# Patient Record
Sex: Female | Born: 1945 | Race: Black or African American | Hispanic: No | Marital: Married | Smoking: Never smoker
Health system: Southern US, Community
[De-identification: ages and names within clinical notes are randomized; demographics above are authoritative.]

---

## 1998-04-03 HISTORY — PX: BREAST CYST ASPIRATION: SHX578

## 1999-12-26 ENCOUNTER — Ambulatory Visit (HOSPITAL_COMMUNITY): Admission: RE | Admit: 1999-12-26 | Discharge: 1999-12-26 | Payer: Self-pay | Admitting: Neurosurgery

## 1999-12-26 ENCOUNTER — Encounter: Payer: Self-pay | Admitting: Neurosurgery

## 2004-01-22 ENCOUNTER — Ambulatory Visit: Payer: Self-pay | Admitting: Unknown Physician Specialty

## 2004-05-18 ENCOUNTER — Ambulatory Visit: Payer: Self-pay | Admitting: Internal Medicine

## 2004-06-01 ENCOUNTER — Ambulatory Visit: Payer: Self-pay | Admitting: Internal Medicine

## 2004-09-16 ENCOUNTER — Ambulatory Visit: Payer: Self-pay | Admitting: Internal Medicine

## 2004-10-01 ENCOUNTER — Ambulatory Visit: Payer: Self-pay | Admitting: Internal Medicine

## 2005-03-14 ENCOUNTER — Ambulatory Visit: Payer: Self-pay | Admitting: Internal Medicine

## 2005-04-03 ENCOUNTER — Ambulatory Visit: Payer: Self-pay | Admitting: Internal Medicine

## 2005-05-16 ENCOUNTER — Ambulatory Visit: Payer: Self-pay | Admitting: Internal Medicine

## 2005-06-01 ENCOUNTER — Ambulatory Visit: Payer: Self-pay | Admitting: Internal Medicine

## 2005-09-11 ENCOUNTER — Ambulatory Visit: Payer: Self-pay | Admitting: Internal Medicine

## 2005-10-05 ENCOUNTER — Ambulatory Visit: Payer: Self-pay | Admitting: Internal Medicine

## 2006-09-11 ENCOUNTER — Encounter: Payer: Self-pay | Admitting: General Practice

## 2006-10-02 ENCOUNTER — Encounter: Payer: Self-pay | Admitting: General Practice

## 2006-10-10 ENCOUNTER — Ambulatory Visit: Payer: Self-pay | Admitting: Family Medicine

## 2007-01-25 ENCOUNTER — Ambulatory Visit: Payer: Self-pay | Admitting: General Practice

## 2007-05-28 ENCOUNTER — Ambulatory Visit: Payer: Self-pay | Admitting: Family Medicine

## 2007-09-16 ENCOUNTER — Ambulatory Visit: Payer: Self-pay | Admitting: Internal Medicine

## 2007-10-15 ENCOUNTER — Ambulatory Visit: Payer: Self-pay | Admitting: Internal Medicine

## 2008-04-04 ENCOUNTER — Ambulatory Visit: Payer: Self-pay

## 2009-02-11 ENCOUNTER — Ambulatory Visit: Payer: Self-pay | Admitting: Internal Medicine

## 2010-03-10 ENCOUNTER — Ambulatory Visit: Payer: Self-pay | Admitting: Internal Medicine

## 2011-05-04 ENCOUNTER — Ambulatory Visit: Payer: Self-pay | Admitting: Internal Medicine

## 2012-05-10 ENCOUNTER — Ambulatory Visit: Payer: Self-pay | Admitting: Internal Medicine

## 2013-07-04 ENCOUNTER — Ambulatory Visit: Payer: Self-pay | Admitting: Internal Medicine

## 2014-07-07 ENCOUNTER — Ambulatory Visit
Admit: 2014-07-07 | Disposition: A | Discharge status: Home / Self Care | Payer: Self-pay | Attending: Internal Medicine | Admitting: Internal Medicine

## 2014-07-30 ENCOUNTER — Ambulatory Visit
Admit: 2014-07-30 | Disposition: A | Discharge status: Home / Self Care | Payer: Self-pay | Attending: Gastroenterology | Admitting: Gastroenterology

## 2015-07-02 ENCOUNTER — Other Ambulatory Visit: Payer: Self-pay | Admitting: Internal Medicine

## 2015-07-02 DIAGNOSIS — Z1231 Encounter for screening mammogram for malignant neoplasm of breast: Secondary | ICD-10-CM

## 2015-07-12 ENCOUNTER — Ambulatory Visit: Payer: Self-pay | Attending: Internal Medicine

## 2015-07-19 ENCOUNTER — Other Ambulatory Visit: Payer: Self-pay | Admitting: Internal Medicine

## 2015-07-19 ENCOUNTER — Ambulatory Visit
Admission: RE | Admit: 2015-07-19 | Discharge: 2015-07-19 | Disposition: A | Discharge status: Home / Self Care | Payer: Medicare Other | Source: Ambulatory Visit | Attending: Internal Medicine | Admitting: Internal Medicine

## 2015-07-19 DIAGNOSIS — Z1231 Encounter for screening mammogram for malignant neoplasm of breast: Secondary | ICD-10-CM | POA: Insufficient documentation

## 2016-02-15 ENCOUNTER — Ambulatory Visit: Payer: Medicare Other | Attending: Otolaryngology

## 2016-02-15 DIAGNOSIS — G4733 Obstructive sleep apnea (adult) (pediatric): Secondary | ICD-10-CM | POA: Diagnosis not present

## 2016-02-15 DIAGNOSIS — R0683 Snoring: Secondary | ICD-10-CM | POA: Diagnosis present

## 2016-07-11 ENCOUNTER — Other Ambulatory Visit: Payer: Self-pay | Admitting: Internal Medicine

## 2016-07-11 DIAGNOSIS — Z1231 Encounter for screening mammogram for malignant neoplasm of breast: Secondary | ICD-10-CM

## 2016-07-27 ENCOUNTER — Other Ambulatory Visit: Payer: Self-pay

## 2016-07-27 ENCOUNTER — Telehealth: Payer: Self-pay

## 2016-07-27 ENCOUNTER — Telehealth: Payer: Self-pay | Admitting: Gastroenterology

## 2016-07-27 DIAGNOSIS — Z1211 Encounter for screening for malignant neoplasm of colon: Secondary | ICD-10-CM

## 2016-07-27 NOTE — Telephone Encounter (Signed)
Gastroenterology Pre-Procedure Review  Request Date: Aug 22, 2016 Requesting Physician: Dr. Allen Norris  PATIENT REVIEW QUESTIONS: The patient responded to the following health history questions as indicated:    1. Are you having any GI issues? no 2. Do you have a personal history of Polyps? no 3. Do you have a family history of Colon Cancer or Polyps? no 4. Diabetes Mellitus? no 5. Joint replacements in the past 12 months?no 6. Major health problems in the past 3 months?no 7. Any artificial heart valves, MVP, or defibrillator?no    MEDICATIONS & ALLERGIES:    Patient reports the following regarding taking any anticoagulation/antiplatelet therapy:   Plavix, Coumadin, Eliquis, Xarelto, Lovenox, Pradaxa, Brilinta, or Effient? no Aspirin? yes (Baby Aspirin 81mg  )  Patient confirms/reports the following medications:  No current outpatient prescriptions on file.   No current facility-administered medications for this visit.     Patient confirms/reports the following allergies:  Allergies not on file  No orders of the defined types were placed in this encounter.   AUTHORIZATION INFORMATION Primary Insurance: 1D#: Group #:  Secondary Insurance: 1D#: Group #:  SCHEDULE INFORMATION: Date: Aug 22, 2016 Tues Time: Location:Dr. Allen Norris [facility]

## 2016-07-27 NOTE — Telephone Encounter (Signed)
07/27/16 [employer] website Does NOT require prior auth for Screening Colonoscopy [id number] / Z12.11 Decision ID#: [id number].

## 2016-08-11 ENCOUNTER — Ambulatory Visit
Admission: RE | Admit: 2016-08-11 | Discharge: 2016-08-11 | Disposition: A | Discharge status: Home / Self Care | Payer: Medicare Other | Source: Ambulatory Visit | Attending: Internal Medicine | Admitting: Internal Medicine

## 2016-08-11 DIAGNOSIS — Z1231 Encounter for screening mammogram for malignant neoplasm of breast: Secondary | ICD-10-CM | POA: Insufficient documentation

## 2016-08-21 ENCOUNTER — Telehealth: Payer: Self-pay

## 2016-08-21 NOTE — Telephone Encounter (Signed)
Pt called to reschedule colonoscopy from 08/22/16 to 09/19/16. She went out of town this past weeekend.

## 2016-08-31 ENCOUNTER — Telehealth: Payer: Self-pay

## 2016-08-31 NOTE — Telephone Encounter (Signed)
error 

## 2016-09-18 ENCOUNTER — Encounter: Payer: Self-pay | Admitting: *Deleted

## 2016-09-19 ENCOUNTER — Ambulatory Visit
Admission: RE | Admit: 2016-09-19 | Discharge: 2016-09-19 | Disposition: A | Discharge status: Home / Self Care | Payer: Medicare Other | Source: Ambulatory Visit | Attending: Gastroenterology | Admitting: Gastroenterology

## 2016-09-19 ENCOUNTER — Encounter
Admission: RE | Disposition: A | Discharge status: Home / Self Care | Payer: Self-pay | Source: Ambulatory Visit | Attending: Gastroenterology

## 2016-09-19 ENCOUNTER — Ambulatory Visit: Payer: Medicare Other | Admitting: Anesthesiology

## 2016-09-19 ENCOUNTER — Encounter: Payer: Self-pay | Admitting: *Deleted

## 2016-09-19 DIAGNOSIS — K6389 Other specified diseases of intestine: Secondary | ICD-10-CM | POA: Diagnosis not present

## 2016-09-19 DIAGNOSIS — E119 Type 2 diabetes mellitus without complications: Secondary | ICD-10-CM | POA: Insufficient documentation

## 2016-09-19 DIAGNOSIS — K5730 Diverticulosis of large intestine without perforation or abscess without bleeding: Secondary | ICD-10-CM | POA: Diagnosis not present

## 2016-09-19 DIAGNOSIS — Z1211 Encounter for screening for malignant neoplasm of colon: Secondary | ICD-10-CM | POA: Diagnosis present

## 2016-09-19 DIAGNOSIS — D120 Benign neoplasm of cecum: Secondary | ICD-10-CM | POA: Diagnosis not present

## 2016-09-19 DIAGNOSIS — Z79899 Other long term (current) drug therapy: Secondary | ICD-10-CM | POA: Diagnosis not present

## 2016-09-19 DIAGNOSIS — Z7982 Long term (current) use of aspirin: Secondary | ICD-10-CM | POA: Diagnosis not present

## 2016-09-19 HISTORY — PX: COLONOSCOPY WITH PROPOFOL: SHX5780

## 2016-09-19 SURGERY — COLONOSCOPY WITH PROPOFOL
Anesthesia: General

## 2016-09-19 MED ORDER — SODIUM CHLORIDE 0.9 % IV SOLN
INTRAVENOUS | Status: DC
  Administered 2016-09-19: 1000 mL via INTRAVENOUS

## 2016-09-19 MED ORDER — PROPOFOL 500 MG/50ML IV EMUL
INTRAVENOUS | Status: AC
  Filled 2016-09-19: qty 50

## 2016-09-19 MED ORDER — PROPOFOL 10 MG/ML IV BOLUS
INTRAVENOUS | Status: DC | PRN
  Administered 2016-09-19: 70 mg via INTRAVENOUS
  Administered 2016-09-19: 30 mg via INTRAVENOUS
  Administered 2016-09-19: 20 mg via INTRAVENOUS

## 2016-09-19 MED ORDER — LIDOCAINE HCL (PF) 2 % IJ SOLN
INTRAMUSCULAR | Status: AC
  Filled 2016-09-19: qty 2

## 2016-09-19 MED ORDER — PROPOFOL 500 MG/50ML IV EMUL
INTRAVENOUS | Status: DC | PRN
  Administered 2016-09-19: 160 ug/kg/min via INTRAVENOUS

## 2016-09-19 NOTE — Anesthesia Postprocedure Evaluation (Signed)
Anesthesia Post Note  Patient: Glenda Harris  Procedure(s) Performed: Procedure(s) (LRB): COLONOSCOPY WITH PROPOFOL (N/A)  Patient location during evaluation: Endoscopy Anesthesia Type: General Level of consciousness: awake and alert Pain management: pain level controlled Vital Signs Assessment: post-procedure vital signs reviewed and stable Respiratory status: spontaneous breathing, nonlabored ventilation, respiratory function stable and patient connected to nasal cannula oxygen Cardiovascular status: blood pressure returned to baseline and stable Postop Assessment: no signs of nausea or vomiting Anesthetic complications: no     Last Vitals:  Vitals:   09/19/16 0936 09/19/16 0946  BP: (!) 87/53 97/67  Pulse: 78 73  Resp: 14 14  Temp: 36.2 C     Last Pain:  Vitals:   09/19/16 0936  TempSrc: Tympanic                 Martha Clan

## 2016-09-19 NOTE — Anesthesia Preprocedure Evaluation (Signed)
Anesthesia Evaluation  Patient identified by MRN, date of birth, ID band Patient awake    Reviewed: Allergy & Precautions, H&P , NPO status , Patient's Chart, lab work & pertinent test results, reviewed documented beta blocker date and time   History of Anesthesia Complications Negative for: history of anesthetic complications  Airway Mallampati: I  TM Distance: >3 FB Neck ROM: full    Dental  (+) Missing, Teeth Intact, Dental Advidsory Given   Pulmonary neg pulmonary ROS,           Cardiovascular Exercise Tolerance: Good negative cardio ROS       Neuro/Psych negative neurological ROS  negative psych ROS   GI/Hepatic negative GI ROS, Neg liver ROS,   Endo/Other  diabetes (borderline)  Renal/GU negative Renal ROS  negative genitourinary   Musculoskeletal   Abdominal   Peds  Hematology negative hematology ROS (+)   Anesthesia Other Findings History reviewed. No pertinent past medical history.   Reproductive/Obstetrics negative OB ROS                             Anesthesia Physical Anesthesia Plan  ASA: II  Anesthesia Plan: General   Post-op Pain Management:    Induction: Intravenous  PONV Risk Score and Plan: 3 and Propofol  Airway Management Planned: Natural Airway  Additional Equipment:   Intra-op Plan:   Post-operative Plan:   Informed Consent: I have reviewed the patients History and Physical, chart, labs and discussed the procedure including the risks, benefits and alternatives for the proposed anesthesia with the patient or authorized representative who has indicated his/her understanding and acceptance.   Dental Advisory Given  Plan Discussed with: Anesthesiologist, CRNA and Surgeon  Anesthesia Plan Comments:         Anesthesia Quick Evaluation

## 2016-09-19 NOTE — Op Note (Signed)
[facility] Gastroenterology Patient Name: Glenda Harris Procedure Date: 09/19/2016 9:11 AM MRN: [id number] Account #: [id number] Date of Birth: 04/11/1945 Admit Type: Outpatient Age: 71 Room: [facility] ENDO ROOM 4 Gender: Female Note Status: Finalized Procedure:            Colonoscopy Indications:          Screening for colorectal malignant neoplasm Providers:            Lucilla Lame MD, MD Referring MD:         Casilda Carls, MD (Referring MD) Medicines:            Propofol per Anesthesia Complications:        No immediate complications. Procedure:            Pre-Anesthesia Assessment:                       - Prior to the procedure, a History and Physical was                        performed, and patient medications and allergies were                        reviewed. The patient's tolerance of previous                        anesthesia was also reviewed. The risks and benefits of                        the procedure and the sedation options and risks were                        discussed with the patient. All questions were                        answered, and informed consent was obtained. Prior                        Anticoagulants: The patient has taken no previous                        anticoagulant or antiplatelet agents. ASA Grade                        Assessment: II - A patient with mild systemic disease.                        After reviewing the risks and benefits, the patient was                        deemed in satisfactory condition to undergo the                        procedure.                       After obtaining informed consent, the colonoscope was                        passed under direct vision. Throughout the procedure,  the patient's blood pressure, pulse, and oxygen                        saturations were monitored continuously. The                        Colonoscope was introduced through the anus and                         advanced to the the cecum, identified by appendiceal                        orifice and ileocecal valve. The colonoscopy was                        performed without difficulty. The patient tolerated the                        procedure well. The quality of the bowel preparation                        was excellent. Findings:      The perianal and digital rectal examinations were normal.      A 2 mm polyp was found in the cecum. The polyp was sessile. The polyp       was removed with a cold biopsy forceps. Resection and retrieval were       complete.      A few small-mouthed diverticula were found in the entire colon. Impression:           - One 2 mm polyp in the cecum, removed with a cold                        biopsy forceps. Resected and retrieved.                       - Diverticulosis in the entire examined colon. Recommendation:       - Discharge patient to home.                       - Resume previous diet.                       - Continue present medications.                       - Await pathology results.                       - Repeat colonoscopy in 5 years if polyp adenoma and 10                        years if hyperplastic Procedure Code(s):    --- Professional ---                       [phone], Colonoscopy, flexible; with biopsy, single or                        multiple Diagnosis Code(s):    --- Professional ---  Z12.11, Encounter for screening for malignant neoplasm                        of colon                       D12.0, Benign neoplasm of cecum CPT copyright 2016 [employer]. All rights reserved. The codes documented in this report are preliminary and upon coder review may  be revised to meet current compliance requirements. Lucilla Lame MD, MD 09/19/2016 9:35:07 AM This report has been signed electronically. Number of Addenda: 0 Note Initiated On: 09/19/2016 9:11 AM Scope Withdrawal Time: 0 hours 6 minutes 58 seconds   Total Procedure Duration: 0 hours 11 minutes 39 seconds       [facility]

## 2016-09-19 NOTE — H&P (Signed)
   Lucilla Lame, MD [address] [place], [place] [address] Phone: [phone] Fax : [phone]  Primary Care Physician:  Casilda Carls, MD Primary Gastroenterologist:  Dr. Allen Norris  Pre-Procedure History & Physical: HPI:  Glenda Harris is a 71 y.o. female is here for a screening colonoscopy.   History reviewed. No pertinent past medical history.  Past Surgical History:  Procedure Laterality Date  . BREAST CYST ASPIRATION Left 2000   neg    Prior to Admission medications   Medication Sig Start Date End Date Taking? Authorizing Provider  aspirin EC 81 MG tablet Take 81 mg by mouth daily.   Yes [provider]  lisinopril (PRINIVIL,ZESTRIL) 10 MG tablet Take 10 mg by mouth daily.   Yes [provider]  rosuvastatin (CRESTOR) 20 MG tablet Take 20 mg by mouth daily.   Yes [provider]    Allergies as of 07/27/2016  . (Not on File)    History reviewed. No pertinent family history.  Social History   Social History  . Marital status: Married    Spouse name: N/A  . Number of children: N/A  . Years of education: N/A   Occupational History  . Not on file.   Social History Main Topics  . Smoking status: Not on file  . Smokeless tobacco: Not on file  . Alcohol use Not on file  . Drug use: Unknown  . Sexual activity: Not on file   Other Topics Concern  . Not on file   Social History Narrative  . No narrative on file    Review of Systems: See HPI, otherwise negative ROS  Physical Exam: BP 96/73   Pulse 71   Temp (!) 96.7 F (35.9 C) (Tympanic)   Resp 16   Ht 5\' 8"  (1.727 m)   Wt 167 lb (75.8 kg)   SpO2 99%   BMI 25.39 kg/m  General:   Alert,  pleasant and cooperative in NAD Head:  Normocephalic and atraumatic. Neck:  Supple; no masses or thyromegaly. Lungs:  Clear throughout to auscultation.    Heart:  Regular rate and rhythm. Abdomen:  Soft, nontender and nondistended. Normal bowel sounds, without guarding,  and without rebound.   Neurologic:  Alert and  oriented x4;  grossly normal neurologically.  Impression/Plan: Glenda Harris is now here to undergo a screening colonoscopy.  Risks, benefits, and alternatives regarding colonoscopy have been reviewed with the patient.  Questions have been answered.  All parties agreeable.

## 2016-09-19 NOTE — Anesthesia Post-op Follow-up Note (Cosign Needed)
Anesthesia QCDR form completed.        

## 2016-09-19 NOTE — Transfer of Care (Signed)
Immediate Anesthesia Transfer of Care Note  Patient: Glenda Harris  Procedure(s) Performed: Procedure(s): COLONOSCOPY WITH PROPOFOL (N/A)  Patient Location: PACU  Anesthesia Type:General  Level of Consciousness: awake, alert  and oriented  Airway & Oxygen Therapy: Patient Spontanous Breathing and Patient connected to nasal cannula oxygen  Post-op Assessment: Report given to RN and Post -op Vital signs reviewed and stable  Post vital signs: Reviewed and stable  Last Vitals:  Vitals:   09/19/16 0852 09/19/16 0936  BP: 96/73 (!) 87/53  Pulse: 71 78  Resp: 16 14  Temp: (!) 35.9 C 36.2 C    Last Pain:  Vitals:   09/19/16 0936  TempSrc: Tympanic         Complications: No apparent anesthesia complications

## 2016-09-20 ENCOUNTER — Encounter: Payer: Self-pay | Admitting: Gastroenterology

## 2016-09-20 LAB — SURGICAL PATHOLOGY

## 2017-02-01 ENCOUNTER — Encounter: Payer: Self-pay | Admitting: Obstetrics and Gynecology

## 2017-02-01 ENCOUNTER — Ambulatory Visit (INDEPENDENT_AMBULATORY_CARE_PROVIDER_SITE_OTHER): Payer: Medicare Other | Admitting: Obstetrics and Gynecology

## 2017-02-01 VITALS — BP 108/62 | HR 71 | Ht 68.0 in | Wt 169.2 lb

## 2017-02-01 DIAGNOSIS — N952 Postmenopausal atrophic vaginitis: Secondary | ICD-10-CM

## 2017-02-01 DIAGNOSIS — N362 Urethral caruncle: Secondary | ICD-10-CM | POA: Diagnosis not present

## 2017-02-01 NOTE — Progress Notes (Signed)
HPI:      [name] is a 71 y.o. [phone] who LMP was No LMP recorded. Patient has had a hysterectomy.  Subjective:   She presents today sent by her edition because some irregularity (possibly polyps) were noted inside the vagina.  The patient states that she also sees a red spot when she uses a mirror to look at her vagina. She reports no problems with vaginal bleeding or difficulty with intercourse. She describes getting up to use the bathroom at least twice per night but otherwise has no urinary complaints. She previously had a hysterectomy.    Hx: The following portions of the patient's history were reviewed and updated as appropriate:             She  has no past medical history on file. She  does not have any pertinent problems on file. She  has a past surgical history that includes Breast cyst aspiration (Left, 2000) and Colonoscopy with propofol (N/A, 09/19/2016). Her family history is not on file. She  reports that she has never smoked. She has never used smokeless tobacco. She reports that she does not drink alcohol or use drugs. She is allergic to flagyl [metronidazole].       Review of Systems:  Review of Systems  Constitutional: Denied constitutional symptoms, night sweats, recent illness, fatigue, fever, insomnia and weight loss.  Eyes: Denied eye symptoms, eye pain, photophobia, vision change and visual disturbance.  Ears/Nose/Throat/Neck: Denied ear, nose, throat or neck symptoms, hearing loss, nasal discharge, sinus congestion and sore throat.  Cardiovascular: Denied cardiovascular symptoms, arrhythmia, chest pain/pressure, edema, exercise intolerance, orthopnea and palpitations.  Respiratory: Denied pulmonary symptoms, asthma, pleuritic pain, productive sputum, cough, dyspnea and wheezing.  Gastrointestinal: Denied, gastro-esophageal reflux, melena, nausea and vomiting.  Genitourinary: See HPI for additional information.  Musculoskeletal: Denied musculoskeletal  symptoms, stiffness, swelling, muscle weakness and myalgia.  Dermatologic: Denied dermatology symptoms, rash and scar.  Neurologic: Denied neurology symptoms, dizziness, headache, neck pain and syncope.  Psychiatric: Denied psychiatric symptoms, anxiety and depression.  Endocrine: Denied endocrine symptoms including hot flashes and night sweats.   Meds:   Current Outpatient Prescriptions on File Prior to Visit  Medication Sig Dispense Refill  . aspirin EC 81 MG tablet Take 81 mg by mouth daily.    Marland Kitchen lisinopril (PRINIVIL,ZESTRIL) 10 MG tablet Take 10 mg by mouth daily.    . rosuvastatin (CRESTOR) 20 MG tablet Take 20 mg by mouth daily.     No current facility-administered medications on file prior to visit.     Objective:     Vitals:   02/01/17 1024  BP: 108/62  Pulse: 71              Physical examination   Pelvic:   Vulva: Normal appearance.  No lesions.  Vagina:  Atrophic vagina with some punctations representing blood vessels.  Skin tags at vaginal cuff noted -left over tags from previous hysterectomy.  Support:  Adequate pelvic support  Urethra No masses tenderness or scarring.  Very small urethral caruncle noted  Meatus Normal size without lesions or prolapse.  Cervix:  Surgically absent  Anus: Normal exam.  No lesions.  Perineum: Normal exam.  No lesions.        Bimanual   Uterus:  Surgically absent  Adnexae: No masses.  Non-tender to palpation.  Cul-de-sac: Negative for abnormality.     Assessment:    [id number]    1. Atrophic vaginitis   2. Urethral caruncle  No evidence of polyp or other vaginal lesion.  Small skin tags at vaginal apex noted status post previous hysterectomy.   Patient reports no problems from atrophic vaginitis or from her urethral caruncle.  We have discussed the use of estrogen cream for both of these issues but she has declined this at this time.   Plan:            1.  Have discussed physical findings in detail with the patient.   Elective treatment options discussed.  Patient has elected not to do anything at this time since her exam is normal for a 71 year old status post hysterectomy. Orders No orders of the defined types were placed in this encounter.   No orders of the defined types were placed in this encounter.     F/U  Return for Pt to contact us if symptoms worsen. I spent 31 minutes with this patient of which greater than 50% was spent discussing vaginal physical findings as described above, possible treatment options, meaning of vaginal cuff skin tags and urethral caruncle.  All of her questions were answered.  Finis Bud, M.D. 02/01/2017 11:23 AM

## 2017-02-28 ENCOUNTER — Encounter: Payer: Self-pay | Admitting: Obstetrics and Gynecology

## 2017-07-24 ENCOUNTER — Other Ambulatory Visit: Payer: Self-pay | Admitting: Internal Medicine

## 2017-07-24 DIAGNOSIS — Z1231 Encounter for screening mammogram for malignant neoplasm of breast: Secondary | ICD-10-CM

## 2017-08-13 ENCOUNTER — Ambulatory Visit
Admission: RE | Admit: 2017-08-13 | Discharge: 2017-08-13 | Disposition: A | Discharge status: Home / Self Care | Payer: Medicare Other | Source: Ambulatory Visit | Attending: Internal Medicine | Admitting: Internal Medicine

## 2017-08-13 DIAGNOSIS — Z1231 Encounter for screening mammogram for malignant neoplasm of breast: Secondary | ICD-10-CM | POA: Diagnosis not present

## 2018-09-09 ENCOUNTER — Other Ambulatory Visit: Payer: Self-pay | Admitting: Internal Medicine

## 2018-09-09 DIAGNOSIS — Z1231 Encounter for screening mammogram for malignant neoplasm of breast: Secondary | ICD-10-CM

## 2018-10-15 ENCOUNTER — Ambulatory Visit (INDEPENDENT_AMBULATORY_CARE_PROVIDER_SITE_OTHER): Payer: Medicare Other | Admitting: Obstetrics and Gynecology

## 2018-10-15 ENCOUNTER — Other Ambulatory Visit: Payer: Self-pay

## 2018-10-15 ENCOUNTER — Encounter: Payer: Self-pay | Admitting: Obstetrics and Gynecology

## 2018-10-15 VITALS — BP 138/76 | HR 71 | Ht 68.0 in | Wt 187.9 lb

## 2018-10-15 DIAGNOSIS — N362 Urethral caruncle: Secondary | ICD-10-CM

## 2018-10-15 DIAGNOSIS — N952 Postmenopausal atrophic vaginitis: Secondary | ICD-10-CM | POA: Diagnosis not present

## 2018-10-15 MED ORDER — ESTROGENS, CONJUGATED 0.625 MG/GM VA CREA: TOPICAL_CREAM | VAGINAL | 1 refills | Status: DC

## 2018-10-15 NOTE — Progress Notes (Signed)
HPI:      [name] is a 73 y.o. [phone] who LMP was No LMP recorded. Patient has had a hysterectomy.  Subjective:   She presents today for her annual examination.  With 2 complaints.  Her first complaint is of vaginal dryness.  She uses a small amount of lubricant every day but this does not seem to be helping as well as it used to. Her second issue is that her urine stream has changed direction and is now spattering and she has noticed a pink lump near her urethra.    Hx: The following portions of the patient's history were reviewed and updated as appropriate:             She  has no past medical history on file. She does not have any pertinent problems on file. She  has a past surgical history that includes Colonoscopy with propofol (N/A, 09/19/2016) and Breast cyst aspiration (Left, 2000). Her family history is not on file. She  reports that she has never smoked. She has never used smokeless tobacco. She reports that she does not drink alcohol or use drugs. She has a current medication list which includes the following prescription(s): aspirin ec, calcium carb-cholecalciferol, cyclobenzaprine, lisinopril, propranolol, and rosuvastatin. She is allergic to flagyl [metronidazole].       Review of Systems:  Review of Systems  Constitutional: Denied constitutional symptoms, night sweats, recent illness, fatigue, fever, insomnia and weight loss.  Eyes: Denied eye symptoms, eye pain, photophobia, vision change and visual disturbance.  Ears/Nose/Throat/Neck: Denied ear, nose, throat or neck symptoms, hearing loss, nasal discharge, sinus congestion and sore throat.  Cardiovascular: Denied cardiovascular symptoms, arrhythmia, chest pain/pressure, edema, exercise intolerance, orthopnea and palpitations.  Respiratory: Denied pulmonary symptoms, asthma, pleuritic pain, productive sputum, cough, dyspnea and wheezing.  Gastrointestinal: Denied, gastro-esophageal reflux, melena, nausea and  vomiting.  Genitourinary: See HPI for additional information.  Musculoskeletal: Denied musculoskeletal symptoms, stiffness, swelling, muscle weakness and myalgia.  Dermatologic: Denied dermatology symptoms, rash and scar.  Neurologic: Denied neurology symptoms, dizziness, headache, neck pain and syncope.  Psychiatric: Denied psychiatric symptoms, anxiety and depression.  Endocrine: Denied endocrine symptoms including hot flashes and night sweats.   Meds:   Current Outpatient Medications on File Prior to Visit  Medication Sig Dispense Refill  . aspirin EC 81 MG tablet Take 81 mg by mouth daily.    . Calcium Carb-Cholecalciferol (CALCIUM 600+D3) 600-200 MG-UNIT TABS Calcium 600 + D(3)    . cyclobenzaprine (FLEXERIL) 10 MG tablet cyclobenzaprine 10 mg tablet    . lisinopril (PRINIVIL,ZESTRIL) 10 MG tablet Take 10 mg by mouth daily.    . propranolol (INDERAL) 10 MG tablet   0  . rosuvastatin (CRESTOR) 20 MG tablet Take 20 mg by mouth daily.     No current facility-administered medications on file prior to visit.     Objective:     Vitals:   10/15/18 0912  BP: 138/76  Pulse: 71       Pelvic:   Vulva: Normal appearance.  No lesions.   Vagina: No lesions or abnormalities noted.  Significant vaginal atrophy noted  Support: Normal pelvic support.  Urethra  urethral caruncle noted.  Slightly larger than noted at last visit.  Meatus Normal size without lesions or prolapse.  Cervix: Surgically absent   Anus: Normal exam.  No lesions.  Perineum: Normal exam.  No lesions.        Bimanual   Uterus: Surgically absent   Adnexae: No masses.  Non-tender to palpation.  Cul-de-sac: Negative for abnormality.     Assessment:    [id number] Patient Active Problem List   Diagnosis Date Noted  . Colon cancer screening   . Benign neoplasm of cecum      1. Atrophic vaginitis   2. Urethral caruncle     Patient now having issues with vaginal atrophy and urethral caruncle. She is now willing  to use medication to try to alleviate these issues.   Plan:            1.  Premarin cream discussed in detail.  Use of Premarin on urethral caruncle discussed.  Use of Premarin intravaginally for vaginal dryness and atrophy discussed. Orders No orders of the defined types were placed in this encounter.   No orders of the defined types were placed in this encounter.       F/U  Return for Pt to contact us if symptoms worsen. I spent 16 minutes involved in the care of this patient of which greater than 50% was spent discussing urethral caruncle, vaginal atrophy, use of estrogen cream.  All questions answered  Finis Bud, M.D. 10/15/2018 9:42 AM

## 2018-10-15 NOTE — Progress Notes (Signed)
Patient comes in today for vaginal dryness and a lump in the vaginal area.

## 2018-10-22 ENCOUNTER — Ambulatory Visit
Admission: RE | Admit: 2018-10-22 | Discharge: 2018-10-22 | Disposition: A | Discharge status: Home / Self Care | Payer: Medicare Other | Source: Ambulatory Visit | Attending: Internal Medicine | Admitting: Internal Medicine

## 2018-10-22 DIAGNOSIS — Z1231 Encounter for screening mammogram for malignant neoplasm of breast: Secondary | ICD-10-CM | POA: Diagnosis not present

## 2018-10-28 DIAGNOSIS — M47816 Spondylosis without myelopathy or radiculopathy, lumbar region: Secondary | ICD-10-CM | POA: Insufficient documentation

## 2018-10-28 DIAGNOSIS — M1611 Unilateral primary osteoarthritis, right hip: Secondary | ICD-10-CM | POA: Insufficient documentation

## 2019-02-10 ENCOUNTER — Other Ambulatory Visit: Payer: Self-pay | Admitting: Surgery

## 2019-02-10 ENCOUNTER — Telehealth: Payer: Self-pay | Admitting: *Deleted

## 2019-02-10 DIAGNOSIS — M4807 Spinal stenosis, lumbosacral region: Secondary | ICD-10-CM

## 2019-02-10 DIAGNOSIS — M47816 Spondylosis without myelopathy or radiculopathy, lumbar region: Secondary | ICD-10-CM

## 2019-03-06 ENCOUNTER — Ambulatory Visit
Admission: RE | Admit: 2019-03-06 | Discharge: 2019-03-06 | Disposition: A | Discharge status: Home / Self Care | Payer: Medicare Other | Source: Ambulatory Visit | Attending: Surgery | Admitting: Surgery

## 2019-03-06 ENCOUNTER — Other Ambulatory Visit: Payer: Self-pay

## 2019-03-06 DIAGNOSIS — M47816 Spondylosis without myelopathy or radiculopathy, lumbar region: Secondary | ICD-10-CM | POA: Diagnosis present

## 2019-03-06 DIAGNOSIS — M4807 Spinal stenosis, lumbosacral region: Secondary | ICD-10-CM | POA: Insufficient documentation

## 2019-03-07 DIAGNOSIS — M5416 Radiculopathy, lumbar region: Secondary | ICD-10-CM | POA: Insufficient documentation

## 2019-04-28 ENCOUNTER — Other Ambulatory Visit: Payer: Self-pay | Admitting: Obstetrics and Gynecology

## 2019-04-28 DIAGNOSIS — N952 Postmenopausal atrophic vaginitis: Secondary | ICD-10-CM

## 2019-04-28 DIAGNOSIS — N362 Urethral caruncle: Secondary | ICD-10-CM

## 2019-09-16 ENCOUNTER — Other Ambulatory Visit: Payer: Self-pay | Admitting: Internal Medicine

## 2019-09-16 DIAGNOSIS — Z1231 Encounter for screening mammogram for malignant neoplasm of breast: Secondary | ICD-10-CM

## 2019-10-01 ENCOUNTER — Other Ambulatory Visit: Payer: Self-pay

## 2019-10-01 ENCOUNTER — Encounter: Payer: Self-pay | Admitting: Obstetrics and Gynecology

## 2019-10-01 ENCOUNTER — Ambulatory Visit (INDEPENDENT_AMBULATORY_CARE_PROVIDER_SITE_OTHER): Payer: Medicare PPO | Admitting: Obstetrics and Gynecology

## 2019-10-01 VITALS — BP 117/78 | HR 81 | Ht 68.0 in | Wt 177.4 lb

## 2019-10-01 DIAGNOSIS — N362 Urethral caruncle: Secondary | ICD-10-CM | POA: Diagnosis not present

## 2019-10-01 DIAGNOSIS — N952 Postmenopausal atrophic vaginitis: Secondary | ICD-10-CM

## 2019-10-01 NOTE — Progress Notes (Signed)
HPI:      [name] is a 74 y.o. G3P3003 who LMP was No LMP recorded. Patient has had a hysterectomy.  Subjective:   She presents today with complaint of vaginal dryness and feels some "internal bumps".  She states that her urethral carbuncle is "about the same".  She uses daily over-the-counter vaginal moisturizers.  She used vaginal estrogen for short time but has discontinued its use. Patient is not currently sexually active.    Hx: The following portions of the patient's history were reviewed and updated as appropriate:             She  has no past medical history on file. She does not have any pertinent problems on file. She  has a past surgical history that includes Colonoscopy with propofol (N/A, 09/19/2016) and Breast cyst aspiration (Left, 2000). Her family history is not on file. She  reports that she has never smoked. She has never used smokeless tobacco. She reports that she does not drink alcohol and does not use drugs. She has a current medication list which includes the following prescription(s): aspirin ec, calcium carb-cholecalciferol, premarin, cyclobenzaprine, propranolol, and rosuvastatin. She is allergic to flagyl [metronidazole].       Review of Systems:  Review of Systems  Constitutional: Denied constitutional symptoms, night sweats, recent illness, fatigue, fever, insomnia and weight loss.  Eyes: Denied eye symptoms, eye pain, photophobia, vision change and visual disturbance.  Ears/Nose/Throat/Neck: Denied ear, nose, throat or neck symptoms, hearing loss, nasal discharge, sinus congestion and sore throat.  Cardiovascular: Denied cardiovascular symptoms, arrhythmia, chest pain/pressure, edema, exercise intolerance, orthopnea and palpitations.  Respiratory: Denied pulmonary symptoms, asthma, pleuritic pain, productive sputum, cough, dyspnea and wheezing.  Gastrointestinal: Denied, gastro-esophageal reflux, melena, nausea and vomiting.  Genitourinary: See  HPI for additional information.  Musculoskeletal: Denied musculoskeletal symptoms, stiffness, swelling, muscle weakness and myalgia.  Dermatologic: Denied dermatology symptoms, rash and scar.  Neurologic: Denied neurology symptoms, dizziness, headache, neck pain and syncope.  Psychiatric: Denied psychiatric symptoms, anxiety and depression.  Endocrine: Denied endocrine symptoms including hot flashes and night sweats.   Meds:   Current Outpatient Medications on File Prior to Visit  Medication Sig Dispense Refill  . aspirin EC 81 MG tablet Take 81 mg by mouth daily.    . Calcium Carb-Cholecalciferol (CALCIUM 600+D3) 600-200 MG-UNIT TABS Calcium 600 + D(3)    . conjugated estrogens (PREMARIN) vaginal cream USE 1/3 APPLICATOR VAGINALLY EVERY EVENING AT BEDTIME FOR 6 WEEKS THEN EVERY OTHER DAY AS DIRECTED 30 g 1  . cyclobenzaprine (FLEXERIL) 10 MG tablet cyclobenzaprine 10 mg tablet    . propranolol (INDERAL) 10 MG tablet   0  . rosuvastatin (CRESTOR) 20 MG tablet Take 20 mg by mouth daily.     No current facility-administered medications on file prior to visit.    Objective:     Vitals:   10/01/19 0852  BP: 117/78  Pulse: 81              Physical examination   Pelvic:  Vulva: Normal appearance.  No lesions.  Vagina: No lesions or abnormalities noted.  Mild to moderate vaginal atrophy  Support: Normal pelvic support.  Urethra No masses tenderness or scarring.  Small urethral caruncle present  Meatus Normal size without lesions or prolapse.  Cervix:  Surgically absent  Anus: Normal exam.  No lesions.  Perineum: Normal exam.  No lesions.     Assessment:    G3P3003 Patient Active Problem List   Diagnosis  Date Noted  . Colon cancer screening   . Benign neoplasm of cecum      1. Atrophic vaginitis   2. Urethral caruncle     I could find no vaginal abnormality or palpate no "internal bumps".  Urethral caruncle is small, of lesser size then previously noted.   Plan:             1.  We have again discussed the use of estrogen vaginal cream for both the urethral caruncle and her vaginal dryness issues.  I believe that with continued extended use she would notice a significant difference and be happy with the results.  All of her questions were answered.  She will begin to use Premarin vaginal cream daily for 1 month, then every other day for 1 month, then twice weekly. Orders No orders of the defined types were placed in this encounter.   No orders of the defined types were placed in this encounter.     F/U  Return for Annual Physical. I spent 22 minutes involved in the care of this patient preparing to see the patient by obtaining and reviewing her medical history (including labs, imaging tests and prior procedures), documenting clinical information in the electronic health record (EHR), counseling and coordinating care plans, writing and sending prescriptions, ordering tests or procedures and directly communicating with the patient by discussing pertinent items from her history and physical exam as well as detailing my assessment and plan as noted above so that she has an informed understanding.  All of her questions were answered.  Finis Bud, M.D. 10/01/2019 9:58 AM

## 2019-10-23 ENCOUNTER — Ambulatory Visit
Admission: RE | Admit: 2019-10-23 | Discharge: 2019-10-23 | Disposition: A | Discharge status: Home / Self Care | Payer: Medicare PPO | Source: Ambulatory Visit | Attending: Internal Medicine | Admitting: Internal Medicine

## 2019-10-23 DIAGNOSIS — Z1231 Encounter for screening mammogram for malignant neoplasm of breast: Secondary | ICD-10-CM | POA: Diagnosis not present

## 2019-10-28 ENCOUNTER — Other Ambulatory Visit: Payer: Self-pay | Admitting: Obstetrics and Gynecology

## 2019-10-28 DIAGNOSIS — N362 Urethral caruncle: Secondary | ICD-10-CM

## 2019-10-28 DIAGNOSIS — N952 Postmenopausal atrophic vaginitis: Secondary | ICD-10-CM

## 2020-10-08 ENCOUNTER — Other Ambulatory Visit: Payer: Self-pay | Admitting: Internal Medicine

## 2020-10-08 DIAGNOSIS — Z1231 Encounter for screening mammogram for malignant neoplasm of breast: Secondary | ICD-10-CM

## 2020-10-13 ENCOUNTER — Other Ambulatory Visit: Payer: Self-pay

## 2020-10-13 MED ORDER — SUPREP BOWEL PREP KIT 17.5-3.13-1.6 GM/177ML PO SOLN: 1.0000 | ORAL | 0 refills | Status: DC

## 2020-10-25 ENCOUNTER — Ambulatory Visit: Payer: Medicare PPO | Admitting: Certified Registered"

## 2020-10-25 ENCOUNTER — Ambulatory Visit
Admission: RE | Admit: 2020-10-25 | Discharge: 2020-10-25 | Disposition: A | Discharge status: Home / Self Care | Payer: Medicare PPO | Source: Ambulatory Visit | Attending: Gastroenterology | Admitting: Gastroenterology

## 2020-10-25 ENCOUNTER — Encounter
Admission: RE | Disposition: A | Discharge status: Home / Self Care | Payer: Self-pay | Source: Ambulatory Visit | Attending: Gastroenterology

## 2020-10-25 DIAGNOSIS — D120 Benign neoplasm of cecum: Secondary | ICD-10-CM | POA: Insufficient documentation

## 2020-10-25 DIAGNOSIS — Z7982 Long term (current) use of aspirin: Secondary | ICD-10-CM | POA: Insufficient documentation

## 2020-10-25 DIAGNOSIS — Z1211 Encounter for screening for malignant neoplasm of colon: Secondary | ICD-10-CM | POA: Insufficient documentation

## 2020-10-25 DIAGNOSIS — K635 Polyp of colon: Secondary | ICD-10-CM | POA: Diagnosis not present

## 2020-10-25 DIAGNOSIS — Z79899 Other long term (current) drug therapy: Secondary | ICD-10-CM | POA: Diagnosis not present

## 2020-10-25 DIAGNOSIS — K5730 Diverticulosis of large intestine without perforation or abscess without bleeding: Secondary | ICD-10-CM | POA: Diagnosis not present

## 2020-10-25 DIAGNOSIS — Z881 Allergy status to other antibiotic agents status: Secondary | ICD-10-CM | POA: Insufficient documentation

## 2020-10-25 DIAGNOSIS — Z79890 Hormone replacement therapy (postmenopausal): Secondary | ICD-10-CM | POA: Diagnosis not present

## 2020-10-25 HISTORY — PX: COLONOSCOPY: SHX5424

## 2020-10-25 SURGERY — COLONOSCOPY
Anesthesia: General

## 2020-10-25 MED ORDER — PROPOFOL 500 MG/50ML IV EMUL
INTRAVENOUS | Status: DC | PRN
  Administered 2020-10-25: 145 ug/kg/min via INTRAVENOUS

## 2020-10-25 MED ORDER — PROPOFOL 10 MG/ML IV BOLUS
INTRAVENOUS | Status: DC | PRN
  Administered 2020-10-25: 30 mg via INTRAVENOUS
  Administered 2020-10-25: 50 mg via INTRAVENOUS
  Administered 2020-10-25: 20 mg via INTRAVENOUS

## 2020-10-25 MED ORDER — SODIUM CHLORIDE 0.9 % IV SOLN
INTRAVENOUS | Status: DC
  Administered 2020-10-25: 20 mL/h via INTRAVENOUS

## 2020-10-25 MED ORDER — LIDOCAINE HCL (CARDIAC) PF 100 MG/5ML IV SOSY
PREFILLED_SYRINGE | INTRAVENOUS | Status: DC | PRN
  Administered 2020-10-25: 100 mg via INTRAVENOUS

## 2020-10-25 MED ORDER — EPHEDRINE SULFATE 50 MG/ML IJ SOLN
INTRAMUSCULAR | Status: DC | PRN
  Administered 2020-10-25 (×2): 5 mg via INTRAVENOUS

## 2020-10-25 NOTE — Anesthesia Preprocedure Evaluation (Signed)
Anesthesia Evaluation  Patient identified by MRN, date of birth, ID band Patient awake    Reviewed: Allergy & Precautions, NPO status , Patient's Chart, lab work & pertinent test results  Airway Mallampati: II  TM Distance: >3 FB Neck ROM: full    Dental  (+) Chipped   Pulmonary neg pulmonary ROS,    Pulmonary exam normal        Cardiovascular negative cardio ROS Normal cardiovascular exam     Neuro/Psych negative neurological ROS  negative psych ROS   GI/Hepatic negative GI ROS, Neg liver ROS,   Endo/Other  negative endocrine ROS  Renal/GU negative Renal ROS  negative genitourinary   Musculoskeletal   Abdominal   Peds  Hematology negative hematology ROS (+)   Anesthesia Other Findings No past medical history on file.  Past Surgical History: 2000: BREAST CYST ASPIRATION; Left     Comment:  neg 09/19/2016: COLONOSCOPY WITH PROPOFOL; N/A     Comment:  Procedure: COLONOSCOPY WITH PROPOFOL;  Surgeon: Lucilla Lame, MD;  Location: [facility] ENDOSCOPY;  Service:               Endoscopy;  Laterality: N/A;  BMI    Body Mass Index: 26.76 kg/m      Reproductive/Obstetrics negative OB ROS                             Anesthesia Physical Anesthesia Plan  ASA: 2  Anesthesia Plan: General   Post-op Pain Management:    Induction: Intravenous  PONV Risk Score and Plan: Propofol infusion and TIVA  Airway Management Planned: Natural Airway and Nasal Cannula  Additional Equipment:   Intra-op Plan:   Post-operative Plan:   Informed Consent: I have reviewed the patients History and Physical, chart, labs and discussed the procedure including the risks, benefits and alternatives for the proposed anesthesia with the patient or authorized representative who has indicated his/her understanding and acceptance.     Dental Advisory Given  Plan Discussed with: Anesthesiologist, CRNA  and Surgeon  Anesthesia Plan Comments: (Patient consented for risks of anesthesia including but not limited to:  - adverse reactions to medications - risk of airway placement if required - damage to eyes, teeth, lips or other oral mucosa - nerve damage due to positioning  - sore throat or hoarseness - Damage to heart, brain, nerves, lungs, other parts of body or loss of life  Patient voiced understanding.)        Anesthesia Quick Evaluation

## 2020-10-25 NOTE — H&P (Signed)
Glenda Bellows, MD [address] [place], [place], [place], [address] [address], [place], [place], [place], [address] Phone: [phone]  Fax: [phone]  Primary Care Physician:  Casilda Carls, MD   Pre-Procedure History & Physical: HPI:  Glenda Harris is a 75 y.o. female is here for an colonoscopy.   No past medical history on file.  Past Surgical History:  Procedure Laterality Date   BREAST CYST ASPIRATION Left 2000   neg   COLONOSCOPY WITH PROPOFOL N/A 09/19/2016   Procedure: COLONOSCOPY WITH PROPOFOL;  Surgeon: Lucilla Lame, MD;  Location: [facility] ENDOSCOPY;  Service: Endoscopy;  Laterality: N/A;    Prior to Admission medications   Medication Sig Start Date End Date Taking? Authorizing Provider  aspirin EC 81 MG tablet Take 81 mg by mouth daily.   Yes [provider]  Calcium Carb-Cholecalciferol 600-200 MG-UNIT TABS Calcium 600 + D(3)   Yes [provider]  conjugated estrogens (PREMARIN) vaginal cream USE 1/3 OF THE APPLICATOR VAGINALLY EVERY EVENING AT BEDTIME FOR 6 WEEKS THEN EVERY OTHER DAY AS DIRECTED. 10/28/19  Yes Harlin Heys, MD  cyclobenzaprine (FLEXERIL) 10 MG tablet cyclobenzaprine 10 mg tablet   Yes [provider]  Na Sulfate-K Sulfate-Mg Sulf (SUPREP BOWEL PREP KIT) 17.5-3.13-1.6 GM/177ML SOLN Take 1 kit by mouth as directed. 10/13/20  Yes Glenda Bellows, MD  propranolol (INDERAL) 10 MG tablet  01/11/17  Yes [provider]  rosuvastatin (CRESTOR) 20 MG tablet Take 20 mg by mouth daily.   Yes [provider]    Allergies as of 10/12/2020 - Review Complete 10/01/2019  Allergen Reaction Noted   Flagyl [metronidazole] Rash 02/01/2017    Family History  Problem Relation Age of Onset   Breast cancer Neg Hx     Social History   Socioeconomic History   Marital status: Married    Spouse name: Not on file   Number of children: Not on file   Years of education: Not on file   Highest education  level: Not on file  Occupational History   Not on file  Tobacco Use   Smoking status: Never   Smokeless tobacco: Never  Vaping Use   Vaping Use: Never used  Substance and Sexual Activity   Alcohol use: No   Drug use: No   Sexual activity: Not Currently    Birth control/protection: None  Other Topics Concern   Not on file  Social History Narrative   Not on file   Social Determinants of Health   Financial Resource Strain: Not on file  Food Insecurity: Not on file  Transportation Needs: Not on file  Physical Activity: Not on file  Stress: Not on file  Social Connections: Not on file  Intimate Partner Violence: Not on file    Review of Systems: See HPI, otherwise negative ROS  Physical Exam: BP 115/75   Pulse 82   Temp (!) 96.9 F (36.1 C) (Temporal)   Resp 20   Ht 5' 8"  (1.727 m)   Wt 79.8 kg   SpO2 98%   BMI 26.76 kg/m  General:   Alert,  pleasant and cooperative in NAD Head:  Normocephalic and atraumatic. Neck:  Supple; no masses or thyromegaly. Lungs:  Clear throughout to auscultation, normal respiratory effort.    Heart:  +S1, +S2, Regular rate and rhythm, No edema. Abdomen:  Soft, nontender and nondistended. Normal bowel sounds, without guarding, and without rebound.   Neurologic:  Alert and  oriented x4;  grossly normal neurologically.  Impression/Plan: [name] is here for an colonoscopy to be performed for Screening colonoscopy average risk   Risks, benefits, limitations, and alternatives regarding  colonoscopy have been reviewed with the patient.  Questions have been answered.  All parties agreeable.   Glenda Bellows, MD  10/25/2020, 8:59 AM

## 2020-10-25 NOTE — Anesthesia Procedure Notes (Signed)
Procedure Name: General with mask airway Date/Time: 10/25/2020 9:15 AM Performed by: Kelton Pillar, CRNA Pre-anesthesia Checklist: Patient identified, Emergency Drugs available, Suction available and Patient being monitored Patient Re-evaluated:Patient Re-evaluated prior to induction Oxygen Delivery Method: Simple face mask Induction Type: IV induction Placement Confirmation: positive ETCO2 and CO2 detector Dental Injury: Teeth and Oropharynx as per pre-operative assessment

## 2020-10-25 NOTE — Transfer of Care (Signed)
Immediate Anesthesia Transfer of Care Note  Patient: Glenda Harris  Procedure(s) Performed: COLONOSCOPY  Patient Location: Endoscopy Unit  Anesthesia Type:General  Level of Consciousness: drowsy and patient cooperative  Airway & Oxygen Therapy: Patient Spontanous Breathing and Patient connected to face mask oxygen  Post-op Assessment: Report given to RN and Post -op Vital signs reviewed and stable  Post vital signs: Reviewed and stable  Last Vitals:  Vitals Value Taken Time  BP    Temp    Pulse 88 10/25/20 0927  Resp 11 10/25/20 0927  SpO2 100 % 10/25/20 0927  Vitals shown include unvalidated device data.  Last Pain:  Vitals:   10/25/20 0842  TempSrc: Temporal  PainSc: 0-No pain         Complications: No notable events documented.

## 2020-10-25 NOTE — Op Note (Signed)
[facility] Patient Name: Glenda Harris Procedure Date: 10/25/2020 9:03 AM MRN: [id number] Account #: [id number] Date of Birth: 1945/07/29 Admit Type: Outpatient Age: 75 Room: [facility] ENDO ROOM 4 Gender: Female Note Status: Finalized Procedure:             Colonoscopy Indications:           Screening for colorectal malignant neoplasm Providers:             Jonathon Bellows MD, MD Referring MD:          Casilda Carls, MD (Referring MD) Medicines:             Monitored Anesthesia Care Complications:         No immediate complications. Procedure:             Pre-Anesthesia Assessment:                        - Prior to the procedure, a History and Physical was                         performed, and patient medications, allergies and                         sensitivities were reviewed. The patient's tolerance                         of previous anesthesia was reviewed.                        - The risks and benefits of the procedure and the                         sedation options and risks were discussed with the                         patient. All questions were answered and informed                         consent was obtained.                        - ASA Grade Assessment: II - A patient with mild                         systemic disease.                        After obtaining informed consent, the colonoscope was                         passed under direct vision. Throughout the procedure,                         the patient's blood pressure, pulse, and oxygen                         saturations were monitored continuously. The                         Colonoscope was introduced through the anus and  advanced to the the cecum, identified by the                         appendiceal orifice. The colonoscopy was performed                         with ease. The patient tolerated the procedure well.                         The quality  of the bowel preparation was excellent. Findings:      The perianal and digital rectal examinations were normal.      A 5 mm polyp was found in the cecum. The polyp was sessile. The polyp       was removed with a cold snare. Resection and retrieval were complete.      Multiple small-mouthed diverticula were found in the entire colon.      The exam was otherwise without abnormality on direct and retroflexion       views. Impression:            - One 5 mm polyp in the cecum, removed with a cold                         snare. Resected and retrieved.                        - Diverticulosis in the entire examined colon.                        - The examination was otherwise normal on direct and                         retroflexion views. Recommendation:        - Discharge patient to home (with escort).                        - Resume previous diet.                        - Continue present medications.                        - Await pathology results.                        - Repeat colonoscopy is not recommended due to current                         age (46 years or older) for surveillance. Procedure Code(s):     --- Professional ---                        [phone], Colonoscopy, flexible; with removal of                         tumor(s), polyp(s), or other lesion(s) by snare                         technique Diagnosis Code(s):     --- Professional ---  Z12.11, Encounter for screening for malignant neoplasm                         of colon                        K63.5, Polyp of colon                        K57.30, Diverticulosis of large intestine without                         perforation or abscess without bleeding CPT copyright 2019 American Medical Association. All rights reserved. The codes documented in this report are preliminary and upon coder review may  be revised to meet current compliance requirements. Jonathon Bellows, MD Jonathon Bellows MD, MD 10/25/2020 9:24:19  AM This report has been signed electronically. Number of Addenda: 0 Note Initiated On: 10/25/2020 9:03 AM Scope Withdrawal Time: 0 hours 8 minutes 44 seconds  Total Procedure Duration: 0 hours 13 minutes 54 seconds  Estimated Blood Loss:  Estimated blood loss: none.      [facility]

## 2020-10-25 NOTE — Anesthesia Postprocedure Evaluation (Signed)
Anesthesia Post Note  Patient: Glenda Harris  Procedure(s) Performed: COLONOSCOPY  Anesthesia Type: General Anesthetic complications: no   No notable events documented.   Last Vitals:  Vitals:   10/25/20 0842  BP: 115/75  Pulse: 82  Resp: 20  Temp: (!) 36.1 C  SpO2: 98%    Last Pain:  Vitals:   10/25/20 0842  TempSrc: Temporal  PainSc: 0-No pain                 Axl Rodino Fletcher-Harrison

## 2020-10-26 ENCOUNTER — Encounter: Payer: Self-pay | Admitting: Gastroenterology

## 2020-10-26 LAB — SURGICAL PATHOLOGY

## 2020-10-27 ENCOUNTER — Encounter: Payer: Self-pay | Admitting: Gastroenterology

## 2020-10-28 ENCOUNTER — Other Ambulatory Visit: Payer: Self-pay

## 2020-10-28 ENCOUNTER — Ambulatory Visit
Admission: RE | Admit: 2020-10-28 | Discharge: 2020-10-28 | Disposition: A | Discharge status: Home / Self Care | Payer: Medicare PPO | Source: Ambulatory Visit | Attending: Internal Medicine | Admitting: Internal Medicine

## 2020-10-28 DIAGNOSIS — Z1231 Encounter for screening mammogram for malignant neoplasm of breast: Secondary | ICD-10-CM | POA: Insufficient documentation

## 2021-01-17 DIAGNOSIS — Z23 Encounter for immunization: Secondary | ICD-10-CM | POA: Diagnosis not present

## 2021-07-01 ENCOUNTER — Other Ambulatory Visit
Admission: RE | Admit: 2021-07-01 | Discharge: 2021-07-01 | Disposition: A | Discharge status: Home / Self Care | Payer: Medicare PPO | Attending: Internal Medicine | Admitting: Internal Medicine

## 2021-07-01 DIAGNOSIS — R002 Palpitations: Secondary | ICD-10-CM | POA: Diagnosis not present

## 2021-07-01 DIAGNOSIS — R5383 Other fatigue: Secondary | ICD-10-CM | POA: Insufficient documentation

## 2021-07-01 DIAGNOSIS — M129 Arthropathy, unspecified: Secondary | ICD-10-CM | POA: Insufficient documentation

## 2021-07-01 DIAGNOSIS — E559 Vitamin D deficiency, unspecified: Secondary | ICD-10-CM | POA: Diagnosis not present

## 2021-07-01 DIAGNOSIS — R0600 Dyspnea, unspecified: Secondary | ICD-10-CM | POA: Diagnosis not present

## 2021-07-01 DIAGNOSIS — E785 Hyperlipidemia, unspecified: Secondary | ICD-10-CM | POA: Insufficient documentation

## 2021-07-01 DIAGNOSIS — R739 Hyperglycemia, unspecified: Secondary | ICD-10-CM | POA: Insufficient documentation

## 2021-07-01 LAB — CBC WITH DIFFERENTIAL/PLATELET
Abs Immature Granulocytes: 0 10*3/uL (ref 0.00–0.07)
Basophils Absolute: 0 10*3/uL (ref 0.0–0.1)
Basophils Relative: 1 %
Eosinophils Absolute: 0.1 10*3/uL (ref 0.0–0.5)
Eosinophils Relative: 2 %
HCT: 38.6 % (ref 36.0–46.0)
Hemoglobin: 12.2 g/dL (ref 12.0–15.0)
Immature Granulocytes: 0 %
Lymphocytes Relative: 44 %
Lymphs Abs: 1.5 10*3/uL (ref 0.7–4.0)
MCH: 30.2 pg (ref 26.0–34.0)
MCHC: 31.6 g/dL (ref 30.0–36.0)
MCV: 95.5 fL (ref 80.0–100.0)
Monocytes Absolute: 0.3 10*3/uL (ref 0.1–1.0)
Monocytes Relative: 9 %
Neutro Abs: 1.5 10*3/uL — ABNORMAL LOW (ref 1.7–7.7)
Neutrophils Relative %: 44 %
Platelets: 217 10*3/uL (ref 150–400)
RBC: 4.04 MIL/uL (ref 3.87–5.11)
RDW: 13.4 % (ref 11.5–15.5)
WBC: 3.5 10*3/uL — ABNORMAL LOW (ref 4.0–10.5)
nRBC: 0 % (ref 0.0–0.2)

## 2021-07-01 LAB — BASIC METABOLIC PANEL
Anion gap: 7 (ref 5–15)
BUN: 22 mg/dL (ref 8–23)
CO2: 28 mmol/L (ref 22–32)
Calcium: 9.6 mg/dL (ref 8.9–10.3)
Chloride: 105 mmol/L (ref 98–111)
Creatinine, Ser: 0.65 mg/dL (ref 0.44–1.00)
GFR, Estimated: >60 mL/min (ref >60)
Glucose, Bld: 84 mg/dL (ref 70–99)
Potassium: 4 mmol/L (ref 3.5–5.1)
Sodium: 140 mmol/L (ref 135–145)

## 2021-07-01 LAB — T4, FREE: Free T4: 0.99 ng/dL (ref 0.61–1.12)

## 2021-07-01 LAB — TSH: TSH: 2.283 u[IU]/mL (ref 0.350–4.500)

## 2021-07-01 LAB — VITAMIN D 25 HYDROXY (VIT D DEFICIENCY, FRACTURES): Vit D, 25-Hydroxy: 72.72 ng/mL (ref 30–100)

## 2021-07-02 LAB — HEMOGLOBIN A1C
Hgb A1c MFr Bld: 5.6 % (ref 4.8–5.6)
Mean Plasma Glucose: 114.02 mg/dL

## 2021-07-02 LAB — T3, FREE: T3, Free: 2.6 pg/mL (ref 2.0–4.4)

## 2021-08-21 IMAGING — MG MM DIGITAL SCREENING BILAT W/ TOMO AND CAD
6 series · 6 of 18 positions shown · non-contrast
Comparison: Previous exam(s).

CLINICAL DATA: Screening.

EXAM:
DIGITAL SCREENING BILATERAL MAMMOGRAM WITH TOMOSYNTHESIS AND CAD
TECHNIQUE: Bilateral screening digital craniocaudal and mediolateral oblique
mammograms were obtained. Bilateral screening digital breast
tomosynthesis was performed. The images were evaluated with
computer-aided detection.

[R MLO synth-2D]
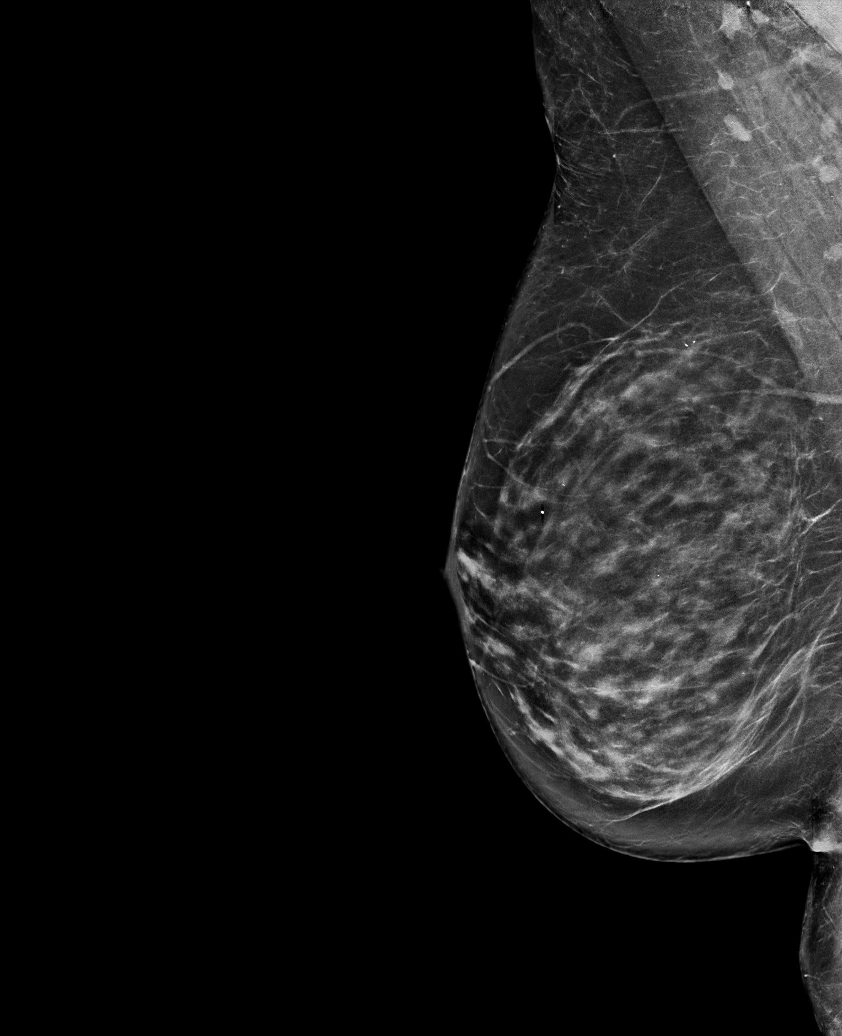

[R CC synth-2D]
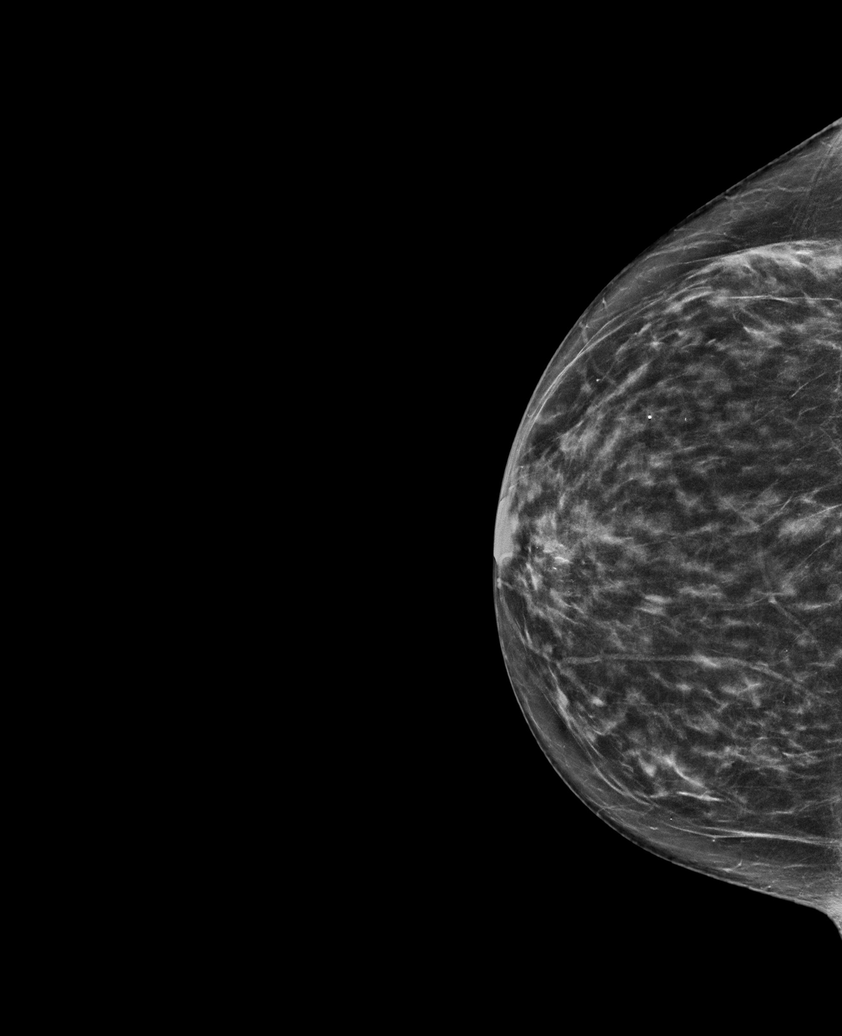

[L MLO synth-2D]
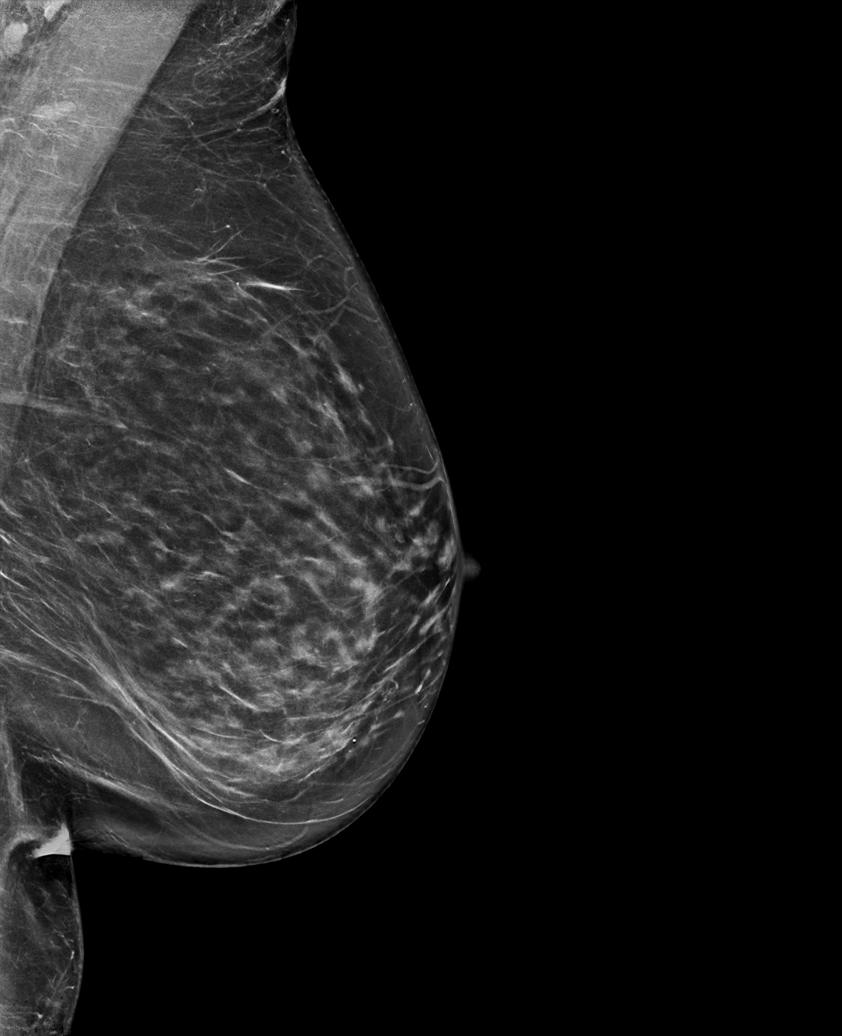

[R CC tomo · tomo slice 35/69.0]
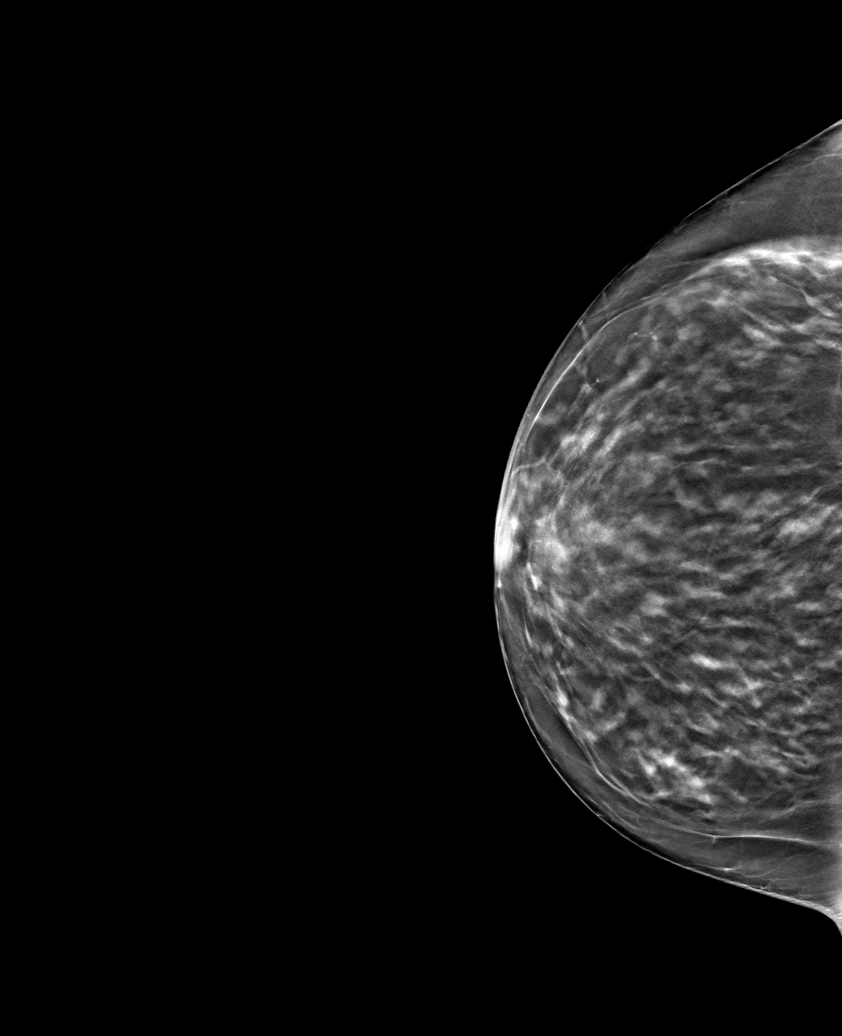

[R MLO tomo · tomo slice 41/81.0]
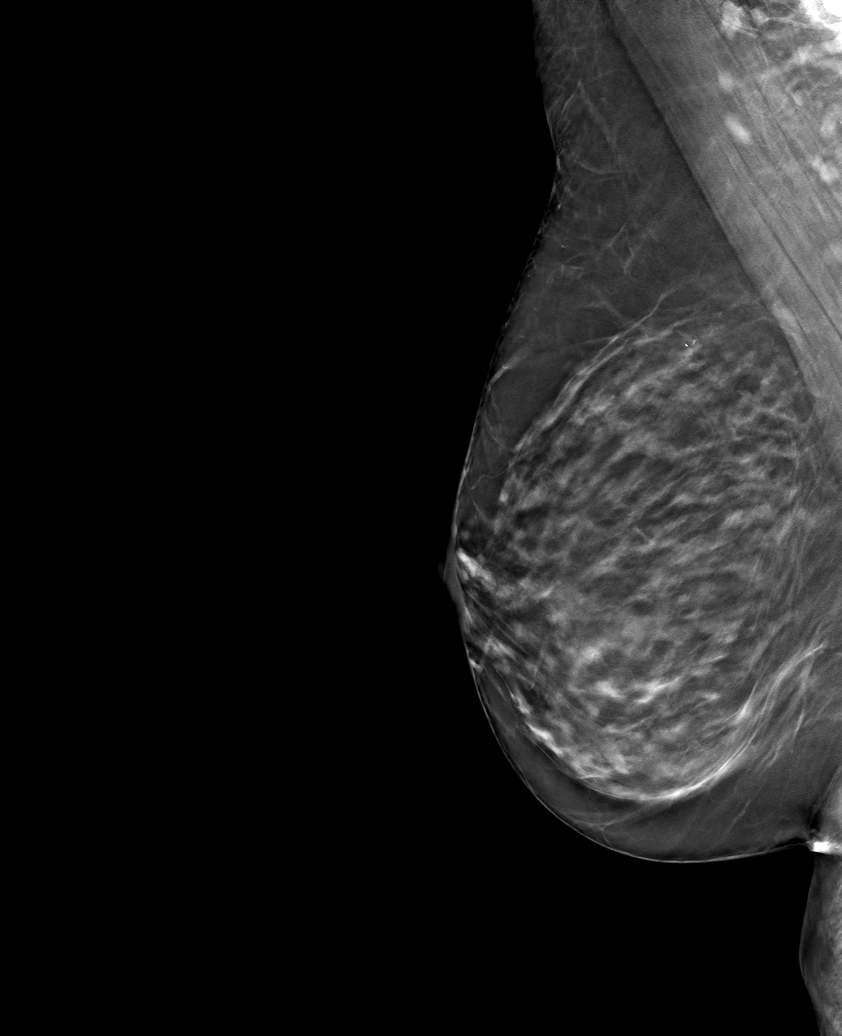

[L CC tomo · tomo slice 38/75.0]
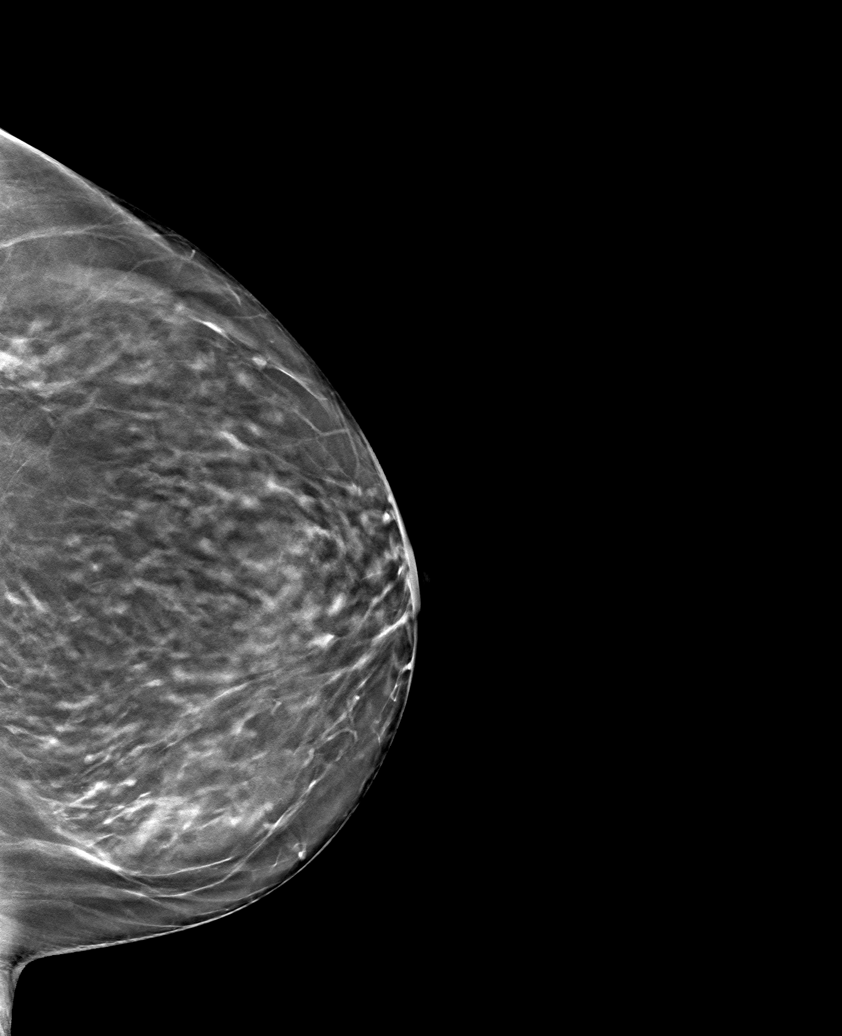

[6 of 18 positions shown; findings below may reference images not displayed]

ACR Breast Density Category c: The breast tissue is heterogeneously
dense, which may obscure small masses.
FINDINGS: There are no findings suspicious for malignancy.
IMPRESSION: No mammographic evidence of malignancy. A result letter of this
screening mammogram will be mailed directly to the patient.

RECOMMENDATION:
Screening mammogram in one year. (Code:Q3-W-BC3)

BI-RADS CATEGORY  1: Negative.

## 2021-09-20 ENCOUNTER — Other Ambulatory Visit: Payer: Self-pay | Admitting: Internal Medicine

## 2021-09-20 DIAGNOSIS — Z1231 Encounter for screening mammogram for malignant neoplasm of breast: Secondary | ICD-10-CM

## 2021-10-31 ENCOUNTER — Ambulatory Visit
Admission: RE | Admit: 2021-10-31 | Discharge: 2021-10-31 | Disposition: A | Discharge status: Home / Self Care | Payer: Medicare PPO | Source: Ambulatory Visit | Attending: Internal Medicine | Admitting: Internal Medicine

## 2021-10-31 DIAGNOSIS — Z1231 Encounter for screening mammogram for malignant neoplasm of breast: Secondary | ICD-10-CM | POA: Diagnosis not present

## 2021-11-29 ENCOUNTER — Other Ambulatory Visit
Admission: RE | Admit: 2021-11-29 | Discharge: 2021-11-29 | Disposition: A | Discharge status: Home / Self Care | Payer: Medicare PPO | Attending: Internal Medicine | Admitting: Internal Medicine

## 2021-11-29 DIAGNOSIS — R739 Hyperglycemia, unspecified: Secondary | ICD-10-CM | POA: Insufficient documentation

## 2021-11-29 DIAGNOSIS — E559 Vitamin D deficiency, unspecified: Secondary | ICD-10-CM | POA: Insufficient documentation

## 2021-11-29 DIAGNOSIS — E785 Hyperlipidemia, unspecified: Secondary | ICD-10-CM | POA: Diagnosis present

## 2021-11-29 DIAGNOSIS — M129 Arthropathy, unspecified: Secondary | ICD-10-CM | POA: Insufficient documentation

## 2021-11-29 DIAGNOSIS — R002 Palpitations: Secondary | ICD-10-CM | POA: Diagnosis not present

## 2021-11-29 LAB — CBC WITH DIFFERENTIAL/PLATELET
Abs Immature Granulocytes: 0.01 10*3/uL (ref 0.00–0.07)
Basophils Absolute: 0 10*3/uL (ref 0.0–0.1)
Basophils Relative: 0 %
Eosinophils Absolute: 0.1 10*3/uL (ref 0.0–0.5)
Eosinophils Relative: 2 %
HCT: 37.2 % (ref 36.0–46.0)
Hemoglobin: 12 g/dL (ref 12.0–15.0)
Immature Granulocytes: 0 %
Lymphocytes Relative: 49 %
Lymphs Abs: 1.5 10*3/uL (ref 0.7–4.0)
MCH: 30.7 pg (ref 26.0–34.0)
MCHC: 32.3 g/dL (ref 30.0–36.0)
MCV: 95.1 fL (ref 80.0–100.0)
Monocytes Absolute: 0.4 10*3/uL (ref 0.1–1.0)
Monocytes Relative: 13 %
Neutro Abs: 1.1 10*3/uL — ABNORMAL LOW (ref 1.7–7.7)
Neutrophils Relative %: 36 %
Platelets: 204 10*3/uL (ref 150–400)
RBC: 3.91 MIL/uL (ref 3.87–5.11)
RDW: 13.6 % (ref 11.5–15.5)
WBC: 3.1 10*3/uL — ABNORMAL LOW (ref 4.0–10.5)
nRBC: 0 % (ref 0.0–0.2)

## 2021-11-29 LAB — LIPID PANEL
Cholesterol: 163 mg/dL (ref 0–200)
HDL: 74 mg/dL (ref >40)
LDL Cholesterol: 78 mg/dL (ref 0–99)
Total CHOL/HDL Ratio: 2.2 RATIO
Triglycerides: 55 mg/dL (ref <150)
VLDL: 11 mg/dL (ref 0–40)

## 2021-11-29 LAB — HEMOGLOBIN A1C
Hgb A1c MFr Bld: 5.9 % — ABNORMAL HIGH (ref 4.8–5.6)
Mean Plasma Glucose: 122.63 mg/dL

## 2021-11-29 LAB — TSH: TSH: 2.795 u[IU]/mL (ref 0.350–4.500)

## 2021-11-29 LAB — T4, FREE: Free T4: 0.68 ng/dL (ref 0.61–1.12)

## 2021-11-29 LAB — VITAMIN D 25 HYDROXY (VIT D DEFICIENCY, FRACTURES): Vit D, 25-Hydroxy: 73.01 ng/mL (ref 30–100)

## 2021-11-30 LAB — T3, FREE: T3, Free: 2.6 pg/mL (ref 2.0–4.4)

## 2022-01-03 ENCOUNTER — Other Ambulatory Visit
Admission: RE | Admit: 2022-01-03 | Discharge: 2022-01-03 | Disposition: A | Discharge status: Home / Self Care | Payer: Medicare PPO | Attending: Internal Medicine | Admitting: Internal Medicine

## 2022-01-03 DIAGNOSIS — M129 Arthropathy, unspecified: Secondary | ICD-10-CM | POA: Insufficient documentation

## 2022-01-03 DIAGNOSIS — R002 Palpitations: Secondary | ICD-10-CM | POA: Insufficient documentation

## 2022-01-03 DIAGNOSIS — R739 Hyperglycemia, unspecified: Secondary | ICD-10-CM | POA: Insufficient documentation

## 2022-01-03 DIAGNOSIS — E559 Vitamin D deficiency, unspecified: Secondary | ICD-10-CM | POA: Insufficient documentation

## 2022-01-03 DIAGNOSIS — E785 Hyperlipidemia, unspecified: Secondary | ICD-10-CM | POA: Insufficient documentation

## 2022-01-04 LAB — MICROALBUMIN / CREATININE URINE RATIO
Creatinine, Urine: 221.9 mg/dL
Microalb Creat Ratio: 17 mg/g creat (ref 0–29)
Microalb, Ur: 38.7 ug/mL — ABNORMAL HIGH

## 2022-02-13 ENCOUNTER — Telehealth: Payer: Self-pay | Admitting: *Deleted

## 2022-02-13 NOTE — Telephone Encounter (Signed)
I have check patient records. It shows that she already had a colonoscopy on 10/25/2020 and Per Dr Vicente Males  The polyp removed from your colon was benign, but precancerous. This means that it had the potential to change into cancer over time had it not been removed.   I suggest no repeat colonoscopy for colon cancer screening. Colonoscopy for colon cancer screening purposes are usually not performed after  age 75.  Any decision to perform one over the age of 67,  would be after discussion with the physician on a case to case basis to assess the risk versus benefits.     I have apologized to patient for the phone call and letter, please discard.  Patient verbalized understanding.

## 2022-03-09 ENCOUNTER — Other Ambulatory Visit
Admission: RE | Admit: 2022-03-09 | Discharge: 2022-03-09 | Disposition: A | Discharge status: Home / Self Care | Payer: Medicare PPO | Source: Ambulatory Visit | Attending: Internal Medicine | Admitting: Internal Medicine

## 2022-03-09 DIAGNOSIS — R739 Hyperglycemia, unspecified: Secondary | ICD-10-CM | POA: Insufficient documentation

## 2022-03-09 DIAGNOSIS — E785 Hyperlipidemia, unspecified: Secondary | ICD-10-CM | POA: Insufficient documentation

## 2022-03-09 DIAGNOSIS — I10 Essential (primary) hypertension: Secondary | ICD-10-CM | POA: Diagnosis not present

## 2022-03-09 DIAGNOSIS — M549 Dorsalgia, unspecified: Secondary | ICD-10-CM | POA: Insufficient documentation

## 2022-03-09 DIAGNOSIS — E559 Vitamin D deficiency, unspecified: Secondary | ICD-10-CM | POA: Insufficient documentation

## 2022-03-09 LAB — BASIC METABOLIC PANEL
Anion gap: 3 — ABNORMAL LOW (ref 5–15)
BUN: 19 mg/dL (ref 8–23)
CO2: 27 mmol/L (ref 22–32)
Calcium: 9.3 mg/dL (ref 8.9–10.3)
Chloride: 109 mmol/L (ref 98–111)
Creatinine, Ser: 0.52 mg/dL (ref 0.44–1.00)
GFR, Estimated: >60 mL/min (ref >60)
Glucose, Bld: 92 mg/dL (ref 70–99)
Potassium: 4.1 mmol/L (ref 3.5–5.1)
Sodium: 139 mmol/L (ref 135–145)

## 2022-03-09 LAB — LIPID PANEL
Cholesterol: 165 mg/dL (ref 0–200)
HDL: 74 mg/dL (ref >40)
LDL Cholesterol: 81 mg/dL (ref 0–99)
Total CHOL/HDL Ratio: 2.2 RATIO
Triglycerides: 50 mg/dL (ref <150)
VLDL: 10 mg/dL (ref 0–40)

## 2022-03-09 LAB — HEPATIC FUNCTION PANEL
ALT: 18 U/L (ref 0–44)
AST: 23 U/L (ref 15–41)
Albumin: 3.8 g/dL (ref 3.5–5.0)
Alkaline Phosphatase: 68 U/L (ref 38–126)
Bilirubin, Direct: 0.1 mg/dL (ref 0.0–0.2)
Indirect Bilirubin: 0.9 mg/dL (ref 0.3–0.9)
Total Bilirubin: 1 mg/dL (ref 0.3–1.2)
Total Protein: 7 g/dL (ref 6.5–8.1)

## 2022-03-09 LAB — HEMOGLOBIN A1C
Hgb A1c MFr Bld: 5.6 % (ref 4.8–5.6)
Mean Plasma Glucose: 114 mg/dL

## 2022-03-09 LAB — VITAMIN D 25 HYDROXY (VIT D DEFICIENCY, FRACTURES): Vit D, 25-Hydroxy: 87.68 ng/mL (ref 30–100)

## 2022-03-10 LAB — MICROALBUMIN / CREATININE URINE RATIO
Creatinine, Urine: 117.8 mg/dL
Microalb Creat Ratio: 4 mg/g creat (ref 0–29)
Microalb, Ur: 4.8 ug/mL — ABNORMAL HIGH

## 2022-06-01 ENCOUNTER — Other Ambulatory Visit
Admission: RE | Admit: 2022-06-01 | Discharge: 2022-06-01 | Disposition: A | Discharge status: Home / Self Care | Payer: Medicare PPO | Source: Ambulatory Visit | Attending: Internal Medicine | Admitting: Internal Medicine

## 2022-06-01 DIAGNOSIS — D709 Neutropenia, unspecified: Secondary | ICD-10-CM | POA: Insufficient documentation

## 2022-06-01 DIAGNOSIS — R739 Hyperglycemia, unspecified: Secondary | ICD-10-CM | POA: Diagnosis present

## 2022-06-01 DIAGNOSIS — I10 Essential (primary) hypertension: Secondary | ICD-10-CM | POA: Insufficient documentation

## 2022-06-01 DIAGNOSIS — M129 Arthropathy, unspecified: Secondary | ICD-10-CM | POA: Insufficient documentation

## 2022-06-01 DIAGNOSIS — E559 Vitamin D deficiency, unspecified: Secondary | ICD-10-CM | POA: Diagnosis not present

## 2022-06-01 DIAGNOSIS — E785 Hyperlipidemia, unspecified: Secondary | ICD-10-CM | POA: Insufficient documentation

## 2022-06-01 LAB — HEMOGLOBIN A1C
Hgb A1c MFr Bld: 5.8 % — ABNORMAL HIGH (ref 4.8–5.6)
Mean Plasma Glucose: 120 mg/dL

## 2022-06-01 LAB — BASIC METABOLIC PANEL
Anion gap: 7 (ref 5–15)
BUN: 18 mg/dL (ref 8–23)
CO2: 28 mmol/L (ref 22–32)
Calcium: 9.5 mg/dL (ref 8.9–10.3)
Chloride: 105 mmol/L (ref 98–111)
Creatinine, Ser: 0.65 mg/dL (ref 0.44–1.00)
GFR, Estimated: >60 mL/min (ref >60)
Glucose, Bld: 70 mg/dL (ref 70–99)
Potassium: 4.3 mmol/L (ref 3.5–5.1)
Sodium: 140 mmol/L (ref 135–145)

## 2022-06-01 LAB — VITAMIN D 25 HYDROXY (VIT D DEFICIENCY, FRACTURES): Vit D, 25-Hydroxy: 65.72 ng/mL (ref 30–100)

## 2022-09-25 ENCOUNTER — Other Ambulatory Visit: Payer: Self-pay | Admitting: Internal Medicine

## 2022-09-25 DIAGNOSIS — Z1231 Encounter for screening mammogram for malignant neoplasm of breast: Secondary | ICD-10-CM

## 2022-11-02 ENCOUNTER — Ambulatory Visit
Admission: RE | Admit: 2022-11-02 | Discharge: 2022-11-02 | Disposition: A | Discharge status: Home / Self Care | Payer: Medicare PPO | Source: Ambulatory Visit | Attending: Internal Medicine | Admitting: Internal Medicine

## 2022-11-02 DIAGNOSIS — Z1231 Encounter for screening mammogram for malignant neoplasm of breast: Secondary | ICD-10-CM | POA: Diagnosis present

## 2022-11-09 ENCOUNTER — Encounter (INDEPENDENT_AMBULATORY_CARE_PROVIDER_SITE_OTHER): Payer: Medicare PPO

## 2022-11-09 ENCOUNTER — Other Ambulatory Visit: Payer: Self-pay | Admitting: Internal Medicine

## 2022-11-09 DIAGNOSIS — R0609 Other forms of dyspnea: Secondary | ICD-10-CM

## 2022-11-09 DIAGNOSIS — R079 Chest pain, unspecified: Secondary | ICD-10-CM

## 2022-11-09 MED ORDER — TECHNETIUM TC 99M SESTAMIBI GENERIC - CARDIOLITE
9.6000 | Freq: Once | INTRAVENOUS | Status: AC | PRN
  Administered 2022-11-09: 9.6 via INTRAVENOUS

## 2022-11-09 MED ORDER — TECHNETIUM TC 99M SESTAMIBI GENERIC - CARDIOLITE
34.1000 | Freq: Once | INTRAVENOUS | Status: AC | PRN
  Administered 2022-11-09: 34.1 via INTRAVENOUS

## 2023-06-06 ENCOUNTER — Other Ambulatory Visit: Payer: Self-pay | Admitting: Internal Medicine

## 2023-06-06 ENCOUNTER — Other Ambulatory Visit
Admission: RE | Admit: 2023-06-06 | Discharge: 2023-06-06 | Disposition: A | Discharge status: Home / Self Care | Source: Ambulatory Visit | Attending: Internal Medicine | Admitting: Internal Medicine

## 2023-06-06 DIAGNOSIS — E559 Vitamin D deficiency, unspecified: Secondary | ICD-10-CM | POA: Diagnosis not present

## 2023-06-06 DIAGNOSIS — E119 Type 2 diabetes mellitus without complications: Secondary | ICD-10-CM | POA: Insufficient documentation

## 2023-06-06 DIAGNOSIS — M899 Disorder of bone, unspecified: Secondary | ICD-10-CM | POA: Diagnosis not present

## 2023-06-06 DIAGNOSIS — R5383 Other fatigue: Secondary | ICD-10-CM | POA: Diagnosis present

## 2023-06-06 DIAGNOSIS — I10 Essential (primary) hypertension: Secondary | ICD-10-CM | POA: Diagnosis not present

## 2023-06-06 DIAGNOSIS — E785 Hyperlipidemia, unspecified: Secondary | ICD-10-CM | POA: Diagnosis not present

## 2023-06-06 DIAGNOSIS — Z1231 Encounter for screening mammogram for malignant neoplasm of breast: Secondary | ICD-10-CM

## 2023-06-06 DIAGNOSIS — D709 Neutropenia, unspecified: Secondary | ICD-10-CM | POA: Diagnosis not present

## 2023-06-06 LAB — BASIC METABOLIC PANEL
Anion gap: 5 (ref 5–15)
BUN: 23 mg/dL (ref 8–23)
CO2: 29 mmol/L (ref 22–32)
Calcium: 9.3 mg/dL (ref 8.9–10.3)
Chloride: 106 mmol/L (ref 98–111)
Creatinine, Ser: 0.69 mg/dL (ref 0.44–1.00)
GFR, Estimated: >60 mL/min (ref >60)
Glucose, Bld: 100 mg/dL — ABNORMAL HIGH (ref 70–99)
Potassium: 3.8 mmol/L (ref 3.5–5.1)
Sodium: 140 mmol/L (ref 135–145)

## 2023-06-06 LAB — TSH: TSH: 2.318 u[IU]/mL (ref 0.350–4.500)

## 2023-06-06 LAB — CBC WITH DIFFERENTIAL/PLATELET
Abs Immature Granulocytes: 0.01 10*3/uL (ref 0.00–0.07)
Basophils Absolute: 0 10*3/uL (ref 0.0–0.1)
Basophils Relative: 0 %
Eosinophils Absolute: 0.1 10*3/uL (ref 0.0–0.5)
Eosinophils Relative: 2 %
HCT: 36.1 % (ref 36.0–46.0)
Hemoglobin: 11.7 g/dL — ABNORMAL LOW (ref 12.0–15.0)
Immature Granulocytes: 0 %
Lymphocytes Relative: 41 %
Lymphs Abs: 1.5 10*3/uL (ref 0.7–4.0)
MCH: 31.6 pg (ref 26.0–34.0)
MCHC: 32.4 g/dL (ref 30.0–36.0)
MCV: 97.6 fL (ref 80.0–100.0)
Monocytes Absolute: 0.4 10*3/uL (ref 0.1–1.0)
Monocytes Relative: 11 %
Neutro Abs: 1.7 10*3/uL (ref 1.7–7.7)
Neutrophils Relative %: 46 %
Platelets: 230 10*3/uL (ref 150–400)
RBC: 3.7 MIL/uL — ABNORMAL LOW (ref 3.87–5.11)
RDW: 13.7 % (ref 11.5–15.5)
WBC: 3.7 10*3/uL — ABNORMAL LOW (ref 4.0–10.5)
nRBC: 0 % (ref 0.0–0.2)

## 2023-06-06 LAB — HEPATIC FUNCTION PANEL
ALT: 15 U/L (ref 0–44)
AST: 18 U/L (ref 15–41)
Albumin: 3.8 g/dL (ref 3.5–5.0)
Alkaline Phosphatase: 68 U/L (ref 38–126)
Bilirubin, Direct: <0.1 mg/dL (ref 0.0–0.2)
Total Bilirubin: 1 mg/dL (ref 0.0–1.2)
Total Protein: 6.7 g/dL (ref 6.5–8.1)

## 2023-06-06 LAB — HEMOGLOBIN A1C
Hgb A1c MFr Bld: 5.6 % (ref 4.8–5.6)
Mean Plasma Glucose: 114.02 mg/dL

## 2023-06-06 LAB — T4, FREE: Free T4: 0.68 ng/dL (ref 0.61–1.12)

## 2023-06-07 LAB — LDL CHOLESTEROL, DIRECT: Direct LDL: 69 mg/dL (ref 0–99)

## 2023-06-08 ENCOUNTER — Other Ambulatory Visit: Payer: Self-pay | Admitting: Internal Medicine

## 2023-06-08 DIAGNOSIS — M816 Localized osteoporosis [Lequesne]: Secondary | ICD-10-CM

## 2023-06-17 LAB — 25-HYDROXY VITAMIN D LCMS D2+D3
25-Hydroxy, Vitamin D-2: <1 ng/mL
25-Hydroxy, Vitamin D-3: 41 ng/mL
25-Hydroxy, Vitamin D: 41 ng/mL

## 2023-06-20 ENCOUNTER — Ambulatory Visit (INDEPENDENT_AMBULATORY_CARE_PROVIDER_SITE_OTHER)

## 2023-06-20 DIAGNOSIS — M8589 Other specified disorders of bone density and structure, multiple sites: Secondary | ICD-10-CM

## 2023-06-20 DIAGNOSIS — M816 Localized osteoporosis [Lequesne]: Secondary | ICD-10-CM

## 2023-07-10 ENCOUNTER — Other Ambulatory Visit
Admission: RE | Admit: 2023-07-10 | Discharge: 2023-07-10 | Disposition: A | Discharge status: Home / Self Care | Source: Ambulatory Visit | Attending: Internal Medicine | Admitting: Internal Medicine

## 2023-07-10 DIAGNOSIS — D509 Iron deficiency anemia, unspecified: Secondary | ICD-10-CM | POA: Insufficient documentation

## 2023-07-10 DIAGNOSIS — E785 Hyperlipidemia, unspecified: Secondary | ICD-10-CM | POA: Diagnosis not present

## 2023-07-10 DIAGNOSIS — E559 Vitamin D deficiency, unspecified: Secondary | ICD-10-CM | POA: Insufficient documentation

## 2023-07-10 DIAGNOSIS — M899 Disorder of bone, unspecified: Secondary | ICD-10-CM | POA: Diagnosis not present

## 2023-07-10 LAB — LIPID PANEL
Cholesterol: 163 mg/dL (ref 0–200)
HDL: 80 mg/dL (ref >40)
LDL Cholesterol: 71 mg/dL (ref 0–99)
Total CHOL/HDL Ratio: 2 ratio
Triglycerides: 62 mg/dL (ref <150)
VLDL: 12 mg/dL (ref 0–40)

## 2023-07-10 LAB — VITAMIN B12: Vitamin B-12: 230 pg/mL (ref 180–914)

## 2023-07-10 LAB — VITAMIN D 25 HYDROXY (VIT D DEFICIENCY, FRACTURES): Vit D, 25-Hydroxy: 44.16 ng/mL (ref 30–100)

## 2023-07-10 LAB — FOLATE: Folate: 21.6 ng/mL (ref >5.9)

## 2023-07-14 LAB — AMB RESULTS CONSOLE CBG: Glucose: 57

## 2023-07-14 LAB — LIPID PANEL
Cholesterol: 163
HDL: 80
LDL: 68
Triglycerides: 76 (ref 40–160)

## 2023-07-14 NOTE — Progress Notes (Unsigned)
 This pt attended 07/14/23 screening event and BP was 137/68 and blood glucose was 57. Pt did not indicate any SDOH needs. Pt was educated on keeping snacks on hand for low blood sugar.  Further f/u to be scheduled per health equity protocol.

## 2023-10-01 NOTE — Progress Notes (Unsigned)
 The patient attended a screening event on 07/14/2023, where her blood pressure was measured at 137/68 mmHg, and her non-fasting glucose was a 57 mg/dl. During the event, the patient reported that she does not smoke, has Medicare for insurance, is established with a primary care provider (F. Jadali), and left X's for all SDOH needs except safety was left blank.  A chart review confirmed that the patient does have an active PCP, Dr. Fayegh Jadali - [facility]. The patient does not have any [facility] visible office visits with her PCP but does labs and scans completed under her PCP. The pt does have Medicare for her insurance and no SDOH needs are indicated at all.  CHW called [facility] to obtain PCP status. Pt does see this PCP but he is retiring. She was seen by him in April and does not have any future appts, the office can only see the labs done via the hospital which were done in April with her PCP. [facility] does not see any encounters in their version of Epic and can not verify any office encounters due to the PCP not using Epic. The pt does have an upcoming mammogram that is on November 05, 2023 at 3:00 PM. CHW called pt to discuss screening event, CHW left vm for pt to return call to discuss screening event. CHW returned call back to the pt. CHW asked the pt if she is still being seen by her PCP. The pt shared that her PCP is retiring and someone else in the office is taking over his patients but could not remember his name. She was just seen by the office around the first week of June and pt shared that she will have a three month appt for follow up and she is waiting to be scheduled with the new provider under [facility].   No additional Health equity team support indicated at this time.

## 2023-11-05 ENCOUNTER — Ambulatory Visit
Admission: RE | Admit: 2023-11-05 | Discharge: 2023-11-05 | Disposition: A | Discharge status: Home / Self Care | Source: Ambulatory Visit | Attending: Internal Medicine | Admitting: Internal Medicine

## 2023-11-05 DIAGNOSIS — Z1231 Encounter for screening mammogram for malignant neoplasm of breast: Secondary | ICD-10-CM | POA: Diagnosis present

## 2024-01-25 ENCOUNTER — Other Ambulatory Visit
Admission: RE | Admit: 2024-01-25 | Discharge: 2024-01-25 | Disposition: A | Discharge status: Home / Self Care | Attending: Internal Medicine | Admitting: Internal Medicine

## 2024-01-25 DIAGNOSIS — E119 Type 2 diabetes mellitus without complications: Secondary | ICD-10-CM | POA: Insufficient documentation

## 2024-01-25 DIAGNOSIS — I10 Essential (primary) hypertension: Secondary | ICD-10-CM | POA: Insufficient documentation

## 2024-01-25 DIAGNOSIS — E785 Hyperlipidemia, unspecified: Secondary | ICD-10-CM | POA: Insufficient documentation

## 2024-01-25 DIAGNOSIS — M899 Disorder of bone, unspecified: Secondary | ICD-10-CM | POA: Insufficient documentation

## 2024-01-25 DIAGNOSIS — E559 Vitamin D deficiency, unspecified: Secondary | ICD-10-CM | POA: Diagnosis not present

## 2024-01-25 DIAGNOSIS — D509 Iron deficiency anemia, unspecified: Secondary | ICD-10-CM | POA: Insufficient documentation

## 2024-01-25 LAB — CBC WITH DIFFERENTIAL/PLATELET
Abs Immature Granulocytes: 0 K/uL (ref 0.00–0.07)
Basophils Absolute: 0 K/uL (ref 0.0–0.1)
Basophils Relative: 0 %
Eosinophils Absolute: 0 K/uL (ref 0.0–0.5)
Eosinophils Relative: 1 %
HCT: 38.5 % (ref 36.0–46.0)
Hemoglobin: 12.3 g/dL (ref 12.0–15.0)
Immature Granulocytes: 0 %
Lymphocytes Relative: 47 %
Lymphs Abs: 1.3 K/uL (ref 0.7–4.0)
MCH: 31.3 pg (ref 26.0–34.0)
MCHC: 31.9 g/dL (ref 30.0–36.0)
MCV: 98 fL (ref 80.0–100.0)
Monocytes Absolute: 0.3 K/uL (ref 0.1–1.0)
Monocytes Relative: 12 %
Neutro Abs: 1.1 K/uL — ABNORMAL LOW (ref 1.7–7.7)
Neutrophils Relative %: 40 %
Platelets: 208 K/uL (ref 150–400)
RBC: 3.93 MIL/uL (ref 3.87–5.11)
RDW: 13.8 % (ref 11.5–15.5)
WBC: 2.8 K/uL — ABNORMAL LOW (ref 4.0–10.5)
nRBC: 0 % (ref 0.0–0.2)

## 2024-01-25 LAB — LIPID PANEL
Cholesterol: 232 mg/dL — ABNORMAL HIGH (ref 0–200)
HDL: 79 mg/dL (ref >40)
LDL Cholesterol: 131 mg/dL — ABNORMAL HIGH (ref 0–99)
Total CHOL/HDL Ratio: 2.9 ratio
Triglycerides: 108 mg/dL (ref <150)
VLDL: 22 mg/dL (ref 0–40)

## 2024-01-25 LAB — HEPATIC FUNCTION PANEL
ALT: 13 U/L (ref 0–44)
AST: 20 U/L (ref 15–41)
Albumin: 3.8 g/dL (ref 3.5–5.0)
Alkaline Phosphatase: 73 U/L (ref 38–126)
Bilirubin, Direct: 0.1 mg/dL (ref 0.0–0.2)
Indirect Bilirubin: 0.9 mg/dL (ref 0.3–0.9)
Total Bilirubin: 1 mg/dL (ref 0.0–1.2)
Total Protein: 7.2 g/dL (ref 6.5–8.1)

## 2024-01-25 LAB — BASIC METABOLIC PANEL WITH GFR
Anion gap: 9 (ref 5–15)
BUN: 19 mg/dL (ref 8–23)
CO2: 26 mmol/L (ref 22–32)
Calcium: 9.7 mg/dL (ref 8.9–10.3)
Chloride: 106 mmol/L (ref 98–111)
Creatinine, Ser: 0.89 mg/dL (ref 0.44–1.00)
GFR, Estimated: >60 mL/min (ref >60)
Glucose, Bld: 81 mg/dL (ref 70–99)
Potassium: 4.6 mmol/L (ref 3.5–5.1)
Sodium: 141 mmol/L (ref 135–145)

## 2024-01-25 LAB — HEMOGLOBIN A1C
Hgb A1c MFr Bld: 5.4 % (ref 4.8–5.6)
Mean Plasma Glucose: 108.28 mg/dL

## 2024-01-25 LAB — VITAMIN D 25 HYDROXY (VIT D DEFICIENCY, FRACTURES): Vit D, 25-Hydroxy: 37.64 ng/mL (ref 30–100)

## 2024-02-11 ENCOUNTER — Encounter: Payer: Self-pay | Admitting: Internal Medicine

## 2024-02-11 ENCOUNTER — Ambulatory Visit: Admitting: Internal Medicine

## 2024-02-11 VITALS — BP 126/78 | HR 71 | Ht 68.0 in | Wt 176.0 lb

## 2024-02-11 DIAGNOSIS — E782 Mixed hyperlipidemia: Secondary | ICD-10-CM | POA: Insufficient documentation

## 2024-02-11 DIAGNOSIS — Z0130 Encounter for examination of blood pressure without abnormal findings: Secondary | ICD-10-CM

## 2024-02-11 DIAGNOSIS — M1611 Unilateral primary osteoarthritis, right hip: Secondary | ICD-10-CM

## 2024-02-11 DIAGNOSIS — N952 Postmenopausal atrophic vaginitis: Secondary | ICD-10-CM | POA: Diagnosis not present

## 2024-02-11 DIAGNOSIS — M7710 Lateral epicondylitis, unspecified elbow: Secondary | ICD-10-CM | POA: Insufficient documentation

## 2024-02-11 DIAGNOSIS — Z23 Encounter for immunization: Secondary | ICD-10-CM | POA: Diagnosis not present

## 2024-02-11 MED ORDER — ESTRADIOL 0.01 % VA CREA: 1.0000 | TOPICAL_CREAM | VAGINAL | 12 refills | Status: AC

## 2024-02-11 MED ORDER — CELECOXIB 200 MG PO CAPS: 200.0000 mg | ORAL_CAPSULE | Freq: Every day | ORAL | 3 refills | Status: AC

## 2024-02-11 MED ORDER — ROSUVASTATIN CALCIUM 20 MG PO TABS: 20.0000 mg | ORAL_TABLET | Freq: Every day | ORAL | 3 refills | Status: AC

## 2024-02-11 NOTE — Progress Notes (Signed)
 New Patient Office Visit  Subjective   Patient ID: Glenda Harris, female    DOB: [birth year]  Age: 78 y.o. MRN: [id number]  CC:  Chief Complaint  Patient presents with   Establish Care    NPE    HPI [name] presents to establish care Previous Primary Care provider/office:   she does have additional concerns to discuss today.   Patient is here today to establish care with office as PCP. She is feeling well and has concerns today. She has PMH that includes: HLD, osteoarthritis, osteopenia, atrophic vaginitis.  Patient recently had labs 01/2024. Her HbgA1c at that time was 5.4% B12 and vitamin D  were normal. Her LDL was elevated to 131. She states she has been out of her cholesterol medication and Celebrex since she saw her previous provider 09/2023. Reports feeling shaky at times after not eating for extended period of time. Reports it happens approximately once a week or less. She states she has a glucose monitor somewhere at home and does not check her blood sugar when this happens. Recommended patient to check her sugar when this occurs to determine if it is low blood sugar related.   Patient reports vaginal bumps today. She was prescribed estradiol cream in the past for atrophic vaginitis and had the same complaints. 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 She has since stopped using the estrogen vaginal cream. Will restart Estradiol cream. Patient reports using lubricants to prevent friction.  Her last colonoscopy was in 2022 and did not recommended further follow ups based of her age. Polyps and diverticulosis was noted at that time. mammogram is up to date; last completed 11/2023  Patient would like a flu shot today.    Outpatient Encounter Medications as of 02/11/2024  Medication Sig   aspirin EC 81 MG tablet Take 81 mg by mouth daily.   estradiol (ESTRACE) 0.01 % CREA vaginal cream Place 1 Applicatorful vaginally 3 (three) times a week.    [DISCONTINUED] celecoxib (CELEBREX) 200 MG capsule Take 1 tablet by mouth daily.   [DISCONTINUED] rosuvastatin (CRESTOR) 20 MG tablet Take 20 mg by mouth daily.   celecoxib (CELEBREX) 200 MG capsule Take 1 capsule (200 mg total) by mouth daily.   rosuvastatin (CRESTOR) 20 MG tablet Take 1 tablet (20 mg total) by mouth daily.   [DISCONTINUED] Calcium Carb-Cholecalciferol 600-200 MG-UNIT TABS Calcium 600 + D(3) (Patient not taking: Reported on 02/11/2024)   [DISCONTINUED] conjugated estrogens  (PREMARIN ) vaginal cream USE 1/3 OF THE APPLICATOR VAGINALLY EVERY EVENING AT BEDTIME FOR 6 WEEKS THEN EVERY OTHER DAY AS DIRECTED. (Patient not taking: Reported on 02/11/2024)   [DISCONTINUED] cyclobenzaprine (FLEXERIL) 10 MG tablet cyclobenzaprine 10 mg tablet (Patient not taking: Reported on 02/11/2024)   [DISCONTINUED] Na Sulfate-K Sulfate-Mg Sulf (SUPREP BOWEL PREP  KIT) 17.5-3.13-1.6 GM/177ML SOLN Take 1 kit by mouth as directed. (Patient not taking: Reported on 02/11/2024)   [DISCONTINUED] propranolol (INDERAL) 10 MG tablet  (Patient not taking: Reported on 02/11/2024)   No facility-administered encounter medications on file as of 02/11/2024.    No past medical history on file.  Past Surgical History:  Procedure Laterality Date   BREAST CYST ASPIRATION Left 2000   neg   COLONOSCOPY N/A 10/25/2020   Procedure: COLONOSCOPY;  Surgeon: Therisa Bi, MD;  Location: [facility] ENDOSCOPY;  Service: Gastroenterology;  Laterality: N/A;   COLONOSCOPY WITH PROPOFOL  N/A 09/19/2016   Procedure: COLONOSCOPY WITH PROPOFOL ;  Surgeon: Jinny Carmine, MD;  Location: [facility] ENDOSCOPY;  Service: Endoscopy;  Laterality: N/A;    Family History  Problem Relation Age of Onset   Breast cancer Neg Hx     Social History   Socioeconomic History   Marital status: Married    Spouse name: Not on file   Number of children: Not on file   Years of education: Not on file   Highest education level: Not on file  Occupational History    Not on file  Tobacco Use   Smoking status: Never   Smokeless tobacco: Never  Vaping Use   Vaping status: Never Used  Substance and Sexual Activity   Alcohol use: No   Drug use: No   Sexual activity: Not Currently    Birth control/protection: None  Other Topics Concern   Not on file  Social History Narrative   Not on file   Social Drivers of Health   Financial Resource Strain: Not on file  Food Insecurity: No Food Insecurity (07/14/2023)   Hunger Vital Sign    Worried About Running Out of Food in the Last Year: Never true    Ran Out of Food in the Last Year: Never true  Transportation Needs: No Transportation Needs (07/14/2023)   PRAPARE - Administrator, Civil Service (Medical): No    Lack of Transportation (Non-Medical): No  Physical Activity: Not on file  Stress: Not on file  Social Connections: Not on file  Intimate Partner Violence: Not At Risk (07/14/2023)   Humiliation, Afraid, Rape, and Kick questionnaire    Fear of Current or Ex-Partner: No    Emotionally Abused: No    Physically Abused: No    Sexually Abused: No    Review of Systems  Constitutional: Negative.  Negative for chills, fever and malaise/fatigue.  HENT: Negative.  Negative for congestion and sore throat.   Eyes: Negative.  Negative for blurred vision and pain.  Respiratory: Negative.  Negative for cough and shortness of breath.   Cardiovascular: Negative.  Negative for chest pain, palpitations and leg swelling.  Gastrointestinal: Negative.  Negative for abdominal pain, blood in stool, constipation, diarrhea, heartburn, melena, nausea and vomiting.  Genitourinary: Negative.  Negative for dysuria, flank pain, frequency and urgency.  Musculoskeletal: Negative.  Negative for joint pain and myalgias.  Skin: Negative.   Neurological: Negative.  Negative for dizziness, tingling, sensory change, weakness and headaches.  Endo/Heme/Allergies: Negative.   Psychiatric/Behavioral: Negative.   Negative for depression and suicidal ideas. The patient is not nervous/anxious.         Objective   BP 126/78   Pulse 71   Ht 5' 8 (1.727 m)   Wt 176 lb (79.8 kg)   SpO2 97%   BMI 26.76 kg/m   Physical Exam Vitals and nursing note reviewed.  Constitutional:      Appearance: Normal appearance.  HENT:     Head: Normocephalic and atraumatic.     Nose: Nose normal.     Mouth/Throat:     Mouth: Mucous membranes are moist.     Pharynx: Oropharynx is clear.  Eyes:     Conjunctiva/sclera: Conjunctivae normal.     Pupils: Pupils are equal, round, and reactive to light.  Cardiovascular:     Rate and Rhythm: Normal rate and regular rhythm.     Pulses: Normal pulses.     Heart sounds: Normal heart sounds. No murmur heard. Pulmonary:     Effort: Pulmonary effort is normal.     Breath sounds: Normal breath sounds. No wheezing.  Abdominal:  General: Bowel sounds are normal.     Palpations: Abdomen is soft.     Tenderness: There is no abdominal tenderness. There is no right CVA tenderness or left CVA tenderness.  Musculoskeletal:        General: Normal range of motion.     Cervical back: Normal range of motion.     Right lower leg: No edema.     Left lower leg: No edema.  Skin:    General: Skin is warm and dry.  Neurological:     General: No focal deficit present.     Mental Status: She is alert and oriented to person, place, and time.  Psychiatric:        Mood and Affect: Mood normal.        Behavior: Behavior normal.        Assessment & Plan:  Refills sent. Start estradiol cream every other day. Flu shot given today. Problem List Items Addressed This Visit     Primary osteoarthritis of right hip   Relevant Medications   celecoxib (CELEBREX) 200 MG capsule   Mixed hyperlipidemia - Primary   Relevant Medications   rosuvastatin (CRESTOR) 20 MG tablet   Atrophic vaginitis   Relevant Medications   estradiol (ESTRACE) 0.01 % CREA vaginal cream   Needs flu shot    Relevant Orders   Flu Vaccine Trivalent High Dose (Fluad) (Completed)    Return in about 2 months (around 04/12/2024).   Total time spent: 40 minutes. This time includes review of previous notes and results and patient face to face interaction during today's visit.    FERNAND FREDY RAMAN, MD  02/11/2024   This document may have been prepared by [facility] Voice Recognition software and as such may include unintentional dictation errors.

## 2024-04-14 ENCOUNTER — Ambulatory Visit: Admitting: Internal Medicine

## 2024-04-14 ENCOUNTER — Encounter: Payer: Self-pay | Admitting: Internal Medicine

## 2024-04-14 VITALS — BP 130/74 | HR 68 | Ht 68.0 in | Wt 177.8 lb

## 2024-04-14 DIAGNOSIS — N952 Postmenopausal atrophic vaginitis: Secondary | ICD-10-CM

## 2024-04-14 DIAGNOSIS — Z0130 Encounter for examination of blood pressure without abnormal findings: Secondary | ICD-10-CM

## 2024-04-14 DIAGNOSIS — E782 Mixed hyperlipidemia: Secondary | ICD-10-CM

## 2024-04-14 NOTE — Progress Notes (Signed)
 "  Established Patient Office Visit  Subjective:  Patient ID: Glenda Harris, female    DOB: 09-Apr-1945  Age: 79 y.o. MRN: [id number]  Chief Complaint  Patient presents with   Follow-up    2 month follow up    Patient comes in for her follow-up today.  She is generally feeling well and is taking her medications regularly.  Fasting for lipid panel today.  She has resumed her estrogen vaginal cream, but does feel discomfort and vaginal dryness in between.  She was told that she has Urethral Caruncles in the past.  However will refer her to her Gyn for further evaluation will send a referral to [facility] ob/gyn. No vaginal discharge or bleeding, no dysuria or burning micturition.  She has no bowel complaints.    No other concerns at this time.   No past medical history on file.  Past Surgical History:  Procedure Laterality Date   BREAST CYST ASPIRATION Left 2000   neg   COLONOSCOPY N/A 10/25/2020   Procedure: COLONOSCOPY;  Surgeon: Therisa Bi, MD;  Location: [facility] ENDOSCOPY;  Service: Gastroenterology;  Laterality: N/A;   COLONOSCOPY WITH PROPOFOL  N/A 09/19/2016   Procedure: COLONOSCOPY WITH PROPOFOL ;  Surgeon: Jinny Carmine, MD;  Location: [facility] ENDOSCOPY;  Service: Endoscopy;  Laterality: N/A;    Social History   Socioeconomic History   Marital status: Married    Spouse name: Not on file   Number of children: Not on file   Years of education: Not on file   Highest education level: Not on file  Occupational History   Not on file  Tobacco Use   Smoking status: Never   Smokeless tobacco: Never  Vaping Use   Vaping status: Never Used  Substance and Sexual Activity   Alcohol use: No   Drug use: No   Sexual activity: Not Currently    Birth control/protection: None  Other Topics Concern   Not on file  Social History Narrative   Not on file   Social Drivers of Health   Tobacco Use: Low Risk (04/14/2024)   Patient History    Smoking Tobacco Use: Never    Smokeless Tobacco  Use: Never    Passive Exposure: Not on file  Financial Resource Strain: Not on file  Food Insecurity: No Food Insecurity (07/14/2023)   Hunger Vital Sign    Worried About Running Out of Food in the Last Year: Never true    Ran Out of Food in the Last Year: Never true  Transportation Needs: No Transportation Needs (07/14/2023)   PRAPARE - Administrator, Civil Service (Medical): No    Lack of Transportation (Non-Medical): No  Physical Activity: Not on file  Stress: Not on file  Social Connections: Not on file  Intimate Partner Violence: Not At Risk (07/14/2023)   Humiliation, Afraid, Rape, and Kick questionnaire    Fear of Current or Ex-Partner: No    Emotionally Abused: No    Physically Abused: No    Sexually Abused: No  Depression (PHQ2-9): Low Risk (02/11/2024)   Depression (PHQ2-9)    PHQ-2 Score: 0  Alcohol Screen: Not on file  Housing: Low Risk (07/14/2023)   Housing Stability Vital Sign    Unable to Pay for Housing in the Last Year: No    Number of Times Moved in the Last Year: 0    Homeless in the Last Year: No  Utilities: Not At Risk (07/14/2023)   [employer] Utilities    Threatened with loss of  utilities: No  Health Literacy: Not on file    Family History  Problem Relation Age of Onset   Breast cancer Neg Hx     Allergies[1]  Show/hide medication list[2]  Review of Systems  Constitutional: Negative.  Negative for chills, fever and malaise/fatigue.  HENT: Negative.  Negative for congestion and sore throat.   Eyes: Negative.  Negative for blurred vision and pain.  Respiratory: Negative.  Negative for cough and shortness of breath.   Cardiovascular: Negative.  Negative for chest pain, palpitations and leg swelling.  Gastrointestinal: Negative.  Negative for abdominal pain, blood in stool, constipation, diarrhea, heartburn, melena, nausea and vomiting.  Genitourinary: Negative.  Negative for dysuria, flank pain, frequency and urgency.  Musculoskeletal:  Negative.  Negative for joint pain and myalgias.  Skin: Negative.   Neurological: Negative.  Negative for dizziness, tingling, sensory change, weakness and headaches.  Endo/Heme/Allergies: Negative.   Psychiatric/Behavioral: Negative.  Negative for depression and suicidal ideas. The patient is not nervous/anxious.        Objective:   BP 130/74   Pulse 68   Ht 5' 8 (1.727 m)   Wt 177 lb 12.8 oz (80.6 kg)   SpO2 98%   BMI 27.03 kg/m   Vitals:   04/14/24 1035  BP: 130/74  Pulse: 68  Height: 5' 8 (1.727 m)  Weight: 177 lb 12.8 oz (80.6 kg)  SpO2: 98%  BMI (Calculated): 27.04    Physical Exam Vitals and nursing note reviewed.  Constitutional:      Appearance: Normal appearance.  HENT:     Head: Normocephalic and atraumatic.     Nose: Nose normal.     Mouth/Throat:     Mouth: Mucous membranes are moist.     Pharynx: Oropharynx is clear.  Eyes:     Conjunctiva/sclera: Conjunctivae normal.     Pupils: Pupils are equal, round, and reactive to light.  Cardiovascular:     Rate and Rhythm: Normal rate and regular rhythm.     Pulses: Normal pulses.     Heart sounds: Normal heart sounds. No murmur heard. Pulmonary:     Effort: Pulmonary effort is normal.     Breath sounds: Normal breath sounds. No wheezing.  Abdominal:     General: Bowel sounds are normal.     Palpations: Abdomen is soft.     Tenderness: There is no abdominal tenderness. There is no right CVA tenderness or left CVA tenderness.  Musculoskeletal:        General: Normal range of motion.     Cervical back: Normal range of motion.     Right lower leg: No edema.     Left lower leg: No edema.  Skin:    General: Skin is warm and dry.  Neurological:     General: No focal deficit present.     Mental Status: She is alert and oriented to person, place, and time.  Psychiatric:        Mood and Affect: Mood normal.        Behavior: Behavior normal.      No results found for any visits on 04/14/24.  Recent  Results (from the past 2160 hours)  Hemoglobin A1c     Status: None   Collection Time: 01/25/24  1:52 PM  Result Value Ref Range   Hgb A1c MFr Bld 5.4 4.8 - 5.6 %    Comment: (NOTE) Diagnosis of Diabetes The following HbA1c ranges recommended by the American Diabetes Association (ADA) may be used  as an aid in the diagnosis of diabetes mellitus.  Hemoglobin             Suggested A1C NGSP%              Diagnosis  <5.7                   Non Diabetic  5.7-6.4                Pre-Diabetic  >6.4                   Diabetic  <7.0                   Glycemic control for                       adults with diabetes.     Mean Plasma Glucose 108.28 mg/dL    Comment: Performed at [facility], [address] [place], [place] [address]  CBC with Differential/Platelet     Status: Abnormal   Collection Time: 01/25/24  1:52 PM  Result Value Ref Range   WBC 2.8 (L) 4.0 - 10.5 K/uL   RBC 3.93 3.87 - 5.11 MIL/uL   Hemoglobin 12.3 12.0 - 15.0 g/dL   HCT 61.4 63.9 - 53.9 %   MCV 98.0 80.0 - 100.0 fL   MCH 31.3 26.0 - 34.0 pg   MCHC 31.9 30.0 - 36.0 g/dL   RDW 86.1 88.4 - 84.4 %   Platelets 208 150 - 400 K/uL   nRBC 0.0 0.0 - 0.2 %   Neutrophils Relative % 40 %   Neutro Abs 1.1 (L) 1.7 - 7.7 K/uL   Lymphocytes Relative 47 %   Lymphs Abs 1.3 0.7 - 4.0 K/uL   Monocytes Relative 12 %   Monocytes Absolute 0.3 0.1 - 1.0 K/uL   Eosinophils Relative 1 %   Eosinophils Absolute 0.0 0.0 - 0.5 K/uL   Basophils Relative 0 %   Basophils Absolute 0.0 0.0 - 0.1 K/uL   Immature Granulocytes 0 %   Abs Immature Granulocytes 0.00 0.00 - 0.07 K/uL    Comment: Performed at [facility], [address] [place], [place] [address]  VITAMIN D  25 Hydroxy (Vit-D Deficiency, Fractures)     Status: None   Collection Time: 01/25/24  1:52 PM  Result Value Ref Range   Vit D, 25-Hydroxy 37.64 30 - 100 ng/mL    Comment: (NOTE) Vitamin D  deficiency has been defined by the Institute of Medicine  and an  Endocrine Society practice guideline as a level of serum 25-OH  vitamin D  less than 20 ng/mL (1,2). The Endocrine Society went on to  further define vitamin D  insufficiency as a level between 21 and 29  ng/mL (2).  1. IOM (Institute of Medicine). 2010. Dietary reference intakes for  calcium  and D. [place]: [employer]. 2. Holick MF, Binkley [place], Bischoff-Ferrari HA, et al. Evaluation,  treatment, and prevention of vitamin D  deficiency: an Endocrine  Society clinical practice guideline, JCEM. 2011 Jul; 96(7): 1911-30.  Performed at [facility], [address] [place], [place] [address]   Lipid panel     Status: Abnormal   Collection Time: 01/25/24  1:52 PM  Result Value Ref Range   Cholesterol 232 (H) 0 - 200 mg/dL   Triglycerides 891 <849 mg/dL   HDL 79 >59 mg/dL   Total CHOL/HDL Ratio 2.9  RATIO   VLDL 22 0 - 40 mg/dL   LDL Cholesterol 868 (H) 0 - 99 mg/dL    Comment:        Total Cholesterol/HDL:CHD Risk Coronary Heart Disease Risk Table                     Men   Women  1/2 Average Risk   3.4   3.3  Average Risk       5.0   4.4  2 X Average Risk   9.6   7.1  3 X Average Risk  23.4   11.0        Use the calculated Patient Ratio above and the CHD Risk Table to determine the patient's CHD Risk.        ATP III CLASSIFICATION (LDL):  <100     mg/dL   Optimal  899-870  mg/dL   Near or Above                    Optimal  130-159  mg/dL   Borderline  839-810  mg/dL   High  >809     mg/dL   Very High Performed at [facility], [address] [place], [place] [address]   Hepatic function panel     Status: None   Collection Time: 01/25/24  1:52 PM  Result Value Ref Range   Total Protein 7.2 6.5 - 8.1 g/dL   Albumin 3.8 3.5 - 5.0 g/dL   AST 20 15 - 41 U/L   ALT 13 0 - 44 U/L   Alkaline Phosphatase 73 38 - 126 U/L   Total Bilirubin 1.0 0.0 - 1.2 mg/dL   Bilirubin, Direct 0.1 0.0 - 0.2 mg/dL   Indirect Bilirubin 0.9 0.3 - 0.9 mg/dL     Comment: Performed at [facility], [address] [place], [place] [address]  Basic metabolic panel     Status: None   Collection Time: 01/25/24  1:52 PM  Result Value Ref Range   Sodium 141 135 - 145 mmol/L   Potassium 4.6 3.5 - 5.1 mmol/L   Chloride 106 98 - 111 mmol/L   CO2 26 22 - 32 mmol/L   Glucose, Bld 81 70 - 99 mg/dL    Comment: Glucose reference range applies only to samples taken after fasting for at least 8 hours.   BUN 19 8 - 23 mg/dL   Creatinine, Ser 9.10 0.44 - 1.00 mg/dL   Calcium  9.7 8.9 - 10.3 mg/dL   GFR, Estimated >39 >39 mL/min    Comment: (NOTE) Calculated using the CKD-EPI Creatinine Equation (2021)    Anion gap 9 5 - 15    Comment: Performed at [facility], [address], [place], [place] [address]      Assessment & Plan:  Continue meds. Check labs. Gyn referral. Problem List Items Addressed This Visit       Genitourinary   Atrophic vaginitis   Relevant Orders   Ambulatory referral to Obstetrics / Gynecology     Other   Mixed hyperlipidemia - Primary   Relevant Orders   Lipid Panel w/o Chol/HDL Ratio   CMP14+EGFR    Return in about 4 months (around 08/12/2024).   Total time spent: 25 minutes. This time includes review of previous notes and results and patient face to face interaction during today's visit.    FERNAND FREDY RAMAN, MD  04/14/2024   This document may have been prepared by  [employer] and as such may include unintentional dictation errors.     [1]  Allergies Allergen Reactions   Flagyl [Metronidazole] Rash  [2]  Outpatient Medications Prior to Visit  Medication Sig   aspirin EC 81 MG tablet Take 81 mg by mouth daily.   celecoxib  (CELEBREX ) 200 MG capsule Take 1 capsule (200 mg total) by mouth daily.   estradiol  (ESTRACE ) 0.01 % CREA vaginal cream Place 1 Applicatorful vaginally 3 (three) times a week.   rosuvastatin  (CRESTOR ) 20 MG tablet Take 1 tablet (20 mg total) by mouth  daily.   No facility-administered medications prior to visit.   "

## 2024-04-15 ENCOUNTER — Ambulatory Visit: Payer: Self-pay | Admitting: Internal Medicine

## 2024-04-15 LAB — LIPID PANEL W/O CHOL/HDL RATIO
Cholesterol, Total: 166 mg/dL (ref 100–199)
HDL: 63 mg/dL
LDL Chol Calc (NIH): 90 mg/dL (ref 0–99)
Triglycerides: 67 mg/dL (ref 0–149)
VLDL Cholesterol Cal: 13 mg/dL (ref 5–40)

## 2024-04-15 LAB — CMP14+EGFR
ALT: 21 IU/L (ref 0–32)
AST: 24 IU/L (ref 0–40)
Albumin: 4.1 g/dL (ref 3.8–4.8)
Alkaline Phosphatase: 92 IU/L (ref 49–135)
BUN/Creatinine Ratio: 27 (ref 12–28)
BUN: 18 mg/dL (ref 8–27)
Bilirubin Total: 0.6 mg/dL (ref 0.0–1.2)
CO2: 25 mmol/L (ref 20–29)
Calcium: 9.9 mg/dL (ref 8.7–10.3)
Chloride: 107 mmol/L — ABNORMAL HIGH (ref 96–106)
Creatinine, Ser: 0.67 mg/dL (ref 0.57–1.00)
Globulin, Total: 2.3 g/dL (ref 1.5–4.5)
Glucose: 89 mg/dL (ref 70–99)
Potassium: 4.6 mmol/L (ref 3.5–5.2)
Sodium: 143 mmol/L (ref 134–144)
Total Protein: 6.4 g/dL (ref 6.0–8.5)
eGFR: 90 mL/min/1.73

## 2024-07-07 ENCOUNTER — Encounter: Admitting: Certified Nurse Midwife

## 2024-08-12 ENCOUNTER — Ambulatory Visit: Admitting: Internal Medicine
